# Patient Record
Sex: Female | Born: 1965 | Race: White | Hispanic: No | Marital: Married | Smoking: Never smoker
Health system: Southern US, Community
[De-identification: ages and names within clinical notes are randomized; demographics above are authoritative.]

## PROBLEM LIST (undated history)

## (undated) DIAGNOSIS — G43909 Migraine, unspecified, not intractable, without status migrainosus: Secondary | ICD-10-CM

## (undated) DIAGNOSIS — Z923 Personal history of irradiation: Secondary | ICD-10-CM

## (undated) DIAGNOSIS — IMO0002 Reserved for concepts with insufficient information to code with codable children: Secondary | ICD-10-CM

## (undated) DIAGNOSIS — R87619 Unspecified abnormal cytological findings in specimens from cervix uteri: Secondary | ICD-10-CM

## (undated) DIAGNOSIS — E785 Hyperlipidemia, unspecified: Secondary | ICD-10-CM

## (undated) DIAGNOSIS — D219 Benign neoplasm of connective and other soft tissue, unspecified: Secondary | ICD-10-CM

## (undated) DIAGNOSIS — I890 Lymphedema, not elsewhere classified: Secondary | ICD-10-CM

## (undated) DIAGNOSIS — K219 Gastro-esophageal reflux disease without esophagitis: Secondary | ICD-10-CM

## (undated) DIAGNOSIS — N200 Calculus of kidney: Secondary | ICD-10-CM

## (undated) DIAGNOSIS — R32 Unspecified urinary incontinence: Secondary | ICD-10-CM

## (undated) DIAGNOSIS — Z87442 Personal history of urinary calculi: Secondary | ICD-10-CM

## (undated) DIAGNOSIS — C50919 Malignant neoplasm of unspecified site of unspecified female breast: Secondary | ICD-10-CM

## (undated) DIAGNOSIS — Z9221 Personal history of antineoplastic chemotherapy: Secondary | ICD-10-CM

## (undated) DIAGNOSIS — N979 Female infertility, unspecified: Secondary | ICD-10-CM

## (undated) HISTORY — DX: Unspecified urinary incontinence: R32

## (undated) HISTORY — PX: COLPOSCOPY: SHX161

## (undated) HISTORY — DX: Migraine, unspecified, not intractable, without status migrainosus: G43.909

## (undated) HISTORY — DX: Reserved for concepts with insufficient information to code with codable children: IMO0002

## (undated) HISTORY — PX: EXPLORATORY LAPAROTOMY: SUR591

## (undated) HISTORY — DX: Female infertility, unspecified: N97.9

## (undated) HISTORY — DX: Unspecified abnormal cytological findings in specimens from cervix uteri: R87.619

## (undated) HISTORY — DX: Malignant neoplasm of unspecified site of unspecified female breast: C50.919

## (undated) HISTORY — PX: CERVICAL CONE BIOPSY: SUR198

## (undated) HISTORY — PX: BREAST SURGERY: SHX581

## (undated) HISTORY — DX: Lymphedema, not elsewhere classified: I89.0

## (undated) HISTORY — DX: Benign neoplasm of connective and other soft tissue, unspecified: D21.9

---

## 1996-10-13 DIAGNOSIS — C50919 Malignant neoplasm of unspecified site of unspecified female breast: Secondary | ICD-10-CM

## 1996-10-13 HISTORY — PX: BREAST LUMPECTOMY: SHX2

## 1996-10-13 HISTORY — DX: Malignant neoplasm of unspecified site of unspecified female breast: C50.919

## 1996-10-13 HISTORY — PX: OTHER SURGICAL HISTORY: SHX169

## 2004-08-20 ENCOUNTER — Other Ambulatory Visit: Admission: RE | Admit: 2004-08-20 | Discharge: 2004-08-20 | Payer: Self-pay | Admitting: Obstetrics and Gynecology

## 2005-02-21 ENCOUNTER — Encounter: Admission: RE | Admit: 2005-02-21 | Discharge: 2005-02-21 | Payer: Self-pay | Admitting: Internal Medicine

## 2005-03-06 ENCOUNTER — Encounter: Admission: RE | Admit: 2005-03-06 | Discharge: 2005-03-06 | Payer: Self-pay | Admitting: Internal Medicine

## 2005-07-25 ENCOUNTER — Ambulatory Visit (HOSPITAL_COMMUNITY): Admission: RE | Admit: 2005-07-25 | Discharge: 2005-07-25 | Payer: Self-pay | Admitting: Internal Medicine

## 2005-11-24 ENCOUNTER — Other Ambulatory Visit: Admission: RE | Admit: 2005-11-24 | Discharge: 2005-11-24 | Payer: Self-pay | Admitting: Obstetrics and Gynecology

## 2006-02-23 ENCOUNTER — Encounter: Admission: RE | Admit: 2006-02-23 | Discharge: 2006-02-23 | Payer: Self-pay | Admitting: Obstetrics and Gynecology

## 2006-04-21 ENCOUNTER — Encounter: Admission: RE | Admit: 2006-04-21 | Discharge: 2006-07-20 | Payer: Self-pay | Admitting: Internal Medicine

## 2006-07-21 ENCOUNTER — Encounter: Admission: RE | Admit: 2006-07-21 | Discharge: 2006-10-19 | Payer: Self-pay | Admitting: Internal Medicine

## 2007-02-02 ENCOUNTER — Ambulatory Visit: Payer: Self-pay | Admitting: Pulmonary Disease

## 2007-02-02 LAB — CONVERTED CEMR LAB
ALT: 21 U/L
AST: 20 U/L
Albumin: 3.8 g/dL
Alkaline Phosphatase: 60 U/L
BUN: 10 mg/dL
Basophils Absolute: 0 K/uL
Basophils Relative: 0.7 %
Bilirubin Urine: NEGATIVE
Bilirubin, Direct: 0.1 mg/dL
CO2: 28 meq/L
Calcium: 8.7 mg/dL
Chloride: 107 meq/L
Cholesterol: 278 mg/dL
Creatinine, Ser: 0.8 mg/dL
Direct LDL: 197.8 mg/dL
Eosinophils Absolute: 0.1 K/uL
Eosinophils Relative: 2.5 %
GFR calc Af Amer: 101 mL/min
GFR calc non Af Amer: 84 mL/min
Glucose, Bld: 103 mg/dL — ABNORMAL HIGH
HCT: 38.6 %
HDL: 51.9 mg/dL
Hemoglobin: 13.6 g/dL
Ketones, ur: NEGATIVE mg/dL
Leukocytes, UA: NEGATIVE
Lymphocytes Relative: 25.3 %
MCHC: 35.3 g/dL
MCV: 93.5 fL
Monocytes Absolute: 0.3 K/uL
Monocytes Relative: 6.8 %
Neutro Abs: 2.8 K/uL
Neutrophils Relative %: 64.7 %
Nitrite: NEGATIVE
Platelets: 268 K/uL
Potassium: 4 meq/L
RBC: 4.13 M/uL
RDW: 11.9 %
Sodium: 140 meq/L
Specific Gravity, Urine: 1.015
TSH: 1.57 u[IU]/mL
Total Bilirubin: 0.8 mg/dL
Total CHOL/HDL Ratio: 5.4
Total Protein, Urine: NEGATIVE mg/dL
Total Protein: 6.6 g/dL
Triglycerides: 170 mg/dL — ABNORMAL HIGH
Urine Glucose: NEGATIVE mg/dL
Urobilinogen, UA: 0.2
VLDL: 34 mg/dL
WBC: 4.3 10*3/microliter — ABNORMAL LOW
pH: 6.5

## 2007-02-11 ENCOUNTER — Ambulatory Visit: Payer: Self-pay | Admitting: Pulmonary Disease

## 2007-02-11 LAB — CONVERTED CEMR LAB
Fecal Occult Blood: NEGATIVE
OCCULT 1: NEGATIVE
OCCULT 2: NEGATIVE
OCCULT 3: NEGATIVE
OCCULT 4: NEGATIVE

## 2007-03-01 ENCOUNTER — Encounter: Admission: RE | Admit: 2007-03-01 | Discharge: 2007-03-01 | Payer: Self-pay | Admitting: Obstetrics and Gynecology

## 2007-05-10 ENCOUNTER — Inpatient Hospital Stay (HOSPITAL_COMMUNITY): Admission: AD | Admit: 2007-05-10 | Discharge: 2007-05-12 | Payer: Self-pay | Admitting: Internal Medicine

## 2007-05-14 ENCOUNTER — Encounter: Admission: RE | Admit: 2007-05-14 | Discharge: 2007-05-14 | Payer: Self-pay | Admitting: *Deleted

## 2007-06-11 ENCOUNTER — Encounter: Admission: RE | Admit: 2007-06-11 | Discharge: 2007-07-05 | Payer: Self-pay | Admitting: *Deleted

## 2008-03-09 ENCOUNTER — Encounter: Admission: RE | Admit: 2008-03-09 | Discharge: 2008-03-09 | Payer: Self-pay | Admitting: Obstetrics and Gynecology

## 2009-03-13 ENCOUNTER — Encounter: Admission: RE | Admit: 2009-03-13 | Discharge: 2009-03-13 | Payer: Self-pay | Admitting: Obstetrics and Gynecology

## 2010-02-04 ENCOUNTER — Encounter: Admission: RE | Admit: 2010-02-04 | Discharge: 2010-02-27 | Payer: Self-pay | Admitting: *Deleted

## 2010-03-22 ENCOUNTER — Encounter: Admission: RE | Admit: 2010-03-22 | Discharge: 2010-03-22 | Payer: Self-pay | Admitting: Obstetrics and Gynecology

## 2011-02-25 NOTE — Discharge Summary (Signed)
[name], Conway              ACCOUNT NO.:  [id number]   MEDICAL RECORD NO.:  [id number]          PATIENT TYPE:  INP   LOCATION:  5737                         FACILITY:  [facility]   PHYSICIAN:  Barnetta Chapel, MDDATE OF BIRTH:  26-Jul-1966   DATE OF ADMISSION:  05/10/2007  DATE OF DISCHARGE:  05/12/2007                               DISCHARGE SUMMARY   PRIMARY CARE [name]:   DISCHARGE DIAGNOSES:  1. Cellulitis of the right upper extremity.  2. Chronic lymphedema of the right upper extremity.  3. History of breast cancer status post lumpectomy and axillary      dissection.  4. Hypokalemia repleted.   DISCHARGE MEDICATIONS:  1. Doxycycline 100 mg p.o. t.i.d. for 10 days.  2. Ambien 500 p.o. q.h.s. p.r.n. (seven tablets given).  3. Vicodin 5/500 one tab p.o. q.6 h. p.r.n. (28 tablets given).   CONSULTATIONS:  None.   BRIEF HISTORY/HOSPITAL COURSE:  Please refer to the History and Physical  done on May 10, 2007.  The patient is a 45 year old female with past  medical history significant for breast cancer status post lumpectomy and  axillary dissection with secondary chronic lymphedema.  The patient was  admitted with cellulitis of the right upper extremity.   The patient was admitted to the regular medical floor.  She was started  on IV vancomycin.  Cellulitis has improved significantly.  She will be  discharged home on p.o. doxycycline.  During her stay in the hospital  the patient's potassium was observed to be low.  Potassium was repleted.   She will be discharged back home today to the care of the primary care  Audrey Conway.   DISCHARGE PLANS:  1. Discharge patient home on above medications.  2. Follow with the primary care [name] in a week.  3. Regular diet  4. Activity as tolerated.      Barnetta Chapel, MD  Electronically Signed    SIO/MEDQ  D:  05/12/2007  T:  05/12/2007  Job:  [id number]

## 2011-02-25 NOTE — H&P (Signed)
NAMECAITLAIN, TWEED              ACCOUNT NO.:  [id number]   MEDICAL RECORD NO.:  [id number]          PATIENT TYPE:  INP   LOCATION:  5737                         FACILITY:  [facility]   PHYSICIAN:  Corinna L. Lendell Caprice, MDDATE OF BIRTH:  [birth year]   DATE OF ADMISSION:  05/10/2007  DATE OF DISCHARGE:                              HISTORY & PHYSICAL   CHIEF COMPLAINT:  Fever and arm pain.   HISTORY OF PRESENT ILLNESS:  Ms. Wheeland is a pleasant 45 year old white  female patient of Dr. Particia Jasper, who is directly admitted for cellulitis  of the right arm.  She has a history of breast cancer and is status post  lumpectomy and axillary dissection on the right with chronic lymphedema  of the right arm.  She hit her neck on a counter 2 days ago and since  then she began having swelling of her shoulder area as well as some  redness; she also had redness that extended down her arm.  She has had  fevers to 103, almost 104 at home.  She has had rigors.  She has had  night sweats.  She took Tylenol today and her fever is down.  She has  had nausea and her appetite has been poor.  She also vomited a few  times.  She has no history of cellulitis in the past.   PAST MEDICAL HISTORY:  As above.   MEDICATIONS:  Tylenol or Motrin as needed.   ALLERGIES:  No known drug allergies.   SOCIAL HISTORY:  She drinks occasionally.  She does not smoke.  No  drugs.   FAMILY HISTORY:  Father has diabetes.   PAST SURGICAL HISTORY:  1. C-section.  2. Lumpectomy and axillary node dissection.   REVIEW OF SYSTEMS:  As above, otherwise negative.   PHYSICAL EXAMINATION:  VITAL SIGNS:  Temperature is 98.4, pulse 115,  respiratory rate 20, blood pressure 131/86.  GENERAL:  The patient is a somewhat anxious-appearing white female.  HEENT:  Normocephalic, atraumatic.  Pupils are equally round and  reactive to light.  Moist mucous membranes.  NECK:  Supple.  No lymphadenopathy.  UPPER EXTREMITIES:  She does have  some fullness over the right clavicle  with slight erythema, but no warmth.  She has erythema, warmth and  induration involving the right arm from the wrist nearly up to the  shoulder circumferentially.  LUNGS:  Clear to auscultation bilaterally without wheezes, rhonchi or  rales.  CARDIOVASCULAR:  Fast, regular.  No murmurs, gallops or rubs.  ABDOMEN:  Normal bowel sounds.  Soft, nontender and nondistended.  GU AND RECTAL:  Deferred.  EXTREMITIES:  As above.  SKIN:  As above.  NEUROLOGIC:  Alert and oriented.  Cranial nerves and sensory and motor  exam are grossly intact.  PSYCHIATRIC:  The patient is somewhat anxious-appearing and tearful.   LABORATORY DATA:  Labs are pending.   ASSESSMENT AND PLAN:  1. Right arm cellulitis.  I will check a CBC, baseline labs, blood      cultures and give IV vancomycin.  She has been admitted to the  floor.  2. History of breast cancer with lumpectomy and axillary node      dissection with resulting chronic lymphedema of the right arm.      Corinna L. Lendell Caprice, MD  Electronically Signed     CLS/MEDQ  D:  05/10/2007  T:  05/11/2007  Job:  [id number]   cc:   Dr. Particia Jasper

## 2011-03-13 ENCOUNTER — Other Ambulatory Visit: Payer: Self-pay | Admitting: Obstetrics and Gynecology

## 2011-03-13 DIAGNOSIS — Z1231 Encounter for screening mammogram for malignant neoplasm of breast: Secondary | ICD-10-CM

## 2011-04-03 ENCOUNTER — Ambulatory Visit
Admission: RE | Admit: 2011-04-03 | Discharge: 2011-04-03 | Disposition: A | Discharge status: Home / Self Care | Payer: BC Managed Care – PPO | Source: Ambulatory Visit | Attending: Obstetrics and Gynecology | Admitting: Obstetrics and Gynecology

## 2011-04-03 DIAGNOSIS — Z1231 Encounter for screening mammogram for malignant neoplasm of breast: Secondary | ICD-10-CM

## 2011-05-13 ENCOUNTER — Telehealth: Payer: Self-pay | Admitting: Pulmonary Disease

## 2011-05-13 NOTE — Telephone Encounter (Signed)
Per SN---so sorry---but SN not seeing any new primary care pts due to his schedule being so booked up.  No openings, unable to get pts in to see him.  SN does not do much primary care now.  But we would be happy to help her set up appt with primary care if she would like.  Will call pt tomorrow to make her aware.

## 2011-05-13 NOTE — Telephone Encounter (Signed)
Leigh, Please ask SN if he wants/willing to see this patient. Thanks.

## 2011-05-13 NOTE — Telephone Encounter (Signed)
Requested pts old chart.  SN will review.

## 2011-05-14 NOTE — Telephone Encounter (Signed)
Called spoke with patient, advised that SN is not accepting new pt's at this time.  Pt verbalized her understanding.  Pt denied assistance with setting up an appt with primary care.  Pt does request a new consult with a pulm physician as she reports she was recently hosp in [place] for PNA.  appt scheduled with RB 8.14.12 @ 1600 > pt aware to arrive ealry with ins card and will bring records to appt.  Will sign off on message.

## 2011-05-27 ENCOUNTER — Encounter: Payer: Self-pay | Admitting: Emergency Medicine

## 2011-05-27 ENCOUNTER — Ambulatory Visit (INDEPENDENT_AMBULATORY_CARE_PROVIDER_SITE_OTHER): Payer: BC Managed Care – PPO | Admitting: Emergency Medicine

## 2011-05-27 ENCOUNTER — Institutional Professional Consult (permissible substitution): Payer: BC Managed Care – PPO | Admitting: Emergency Medicine

## 2011-05-27 DIAGNOSIS — R053 Chronic cough: Secondary | ICD-10-CM

## 2011-05-27 DIAGNOSIS — R918 Other nonspecific abnormal finding of lung field: Secondary | ICD-10-CM

## 2011-05-27 DIAGNOSIS — R9389 Abnormal findings on diagnostic imaging of other specified body structures: Secondary | ICD-10-CM

## 2011-05-27 DIAGNOSIS — R05 Cough: Secondary | ICD-10-CM

## 2011-05-27 NOTE — Progress Notes (Signed)
  Subjective:    Patient ID: Audrey Conway, female    DOB: January 05, 1966, 45 y.o.   MRN: [id number]  HPI 45 yo woman, never smoker, hx of breast CA s/p lumpectomy (1998) c/b lymphedema R arm, allergic rhinitis.  Was traveling in [place] in late July, developed URI type sx, then fever + N/V. Evolved a dry hacking cough. White mucous, no blood.  She sought care when fever got high, felt like she has when "her arm gets infected." She had a CXR with RUL nodular infiltrate. Was treated for possible lymphangitis R arm and also RUL PNA.    Review of Systems  Constitutional: Negative for fever, chills, fatigue and unexpected weight change.  HENT: Positive for congestion. Negative for rhinorrhea, sneezing and postnasal drip.   Respiratory: Positive for cough. Negative for choking, shortness of breath, wheezing and stridor.     Past Medical History  Diagnosis Date  . Breast cancer   . Allergic rhinitis   . Lymphedema      Family History  Problem Relation Age of Onset  . Allergies Father   . Lymphoma Brother      History   Social History  . Marital Status: Married    Spouse Name: N/A    Number of Children: N/A  . Years of Education: N/A   Occupational History  . homemaker    Social History Main Topics  . Smoking status: Never Smoker   . Smokeless tobacco: Never Used  . Alcohol Use: Yes     socially  . Drug Use: No  . Sexually Active: Not on file   Other Topics Concern  . Not on file   Social History Narrative  . No narrative on file     No Known Allergies   No outpatient prescriptions prior to visit.         Objective:   Physical Exam  Gen: Pleasant, well-nourished, in no distress,  normal affect  ENT: No lesions,  mouth clear,  oropharynx clear, no postnasal drip  Neck: No JVD, no TMG, no carotid bruits  Lungs: No use of accessory muscles, no dullness to percussion, clear without rales or rhonchi  Cardiovascular: RRR, heart sounds normal, no murmur or gallops,  no peripheral edema  Musculoskeletal: No deformities, no cyanosis or clubbing  Neuro: alert, non focal  Skin: Warm, no lesions or rashes      Assessment & Plan:  Chronic cough loratadine 10mg  nasanex  If still coughiing in 2 weeks then start omeprazole   Abnormal CXR ROV 3 weeks w a CXR Get old films from [place].

## 2011-05-27 NOTE — Progress Notes (Signed)
  Subjective:    Patient ID: Audrey Conway, female    DOB: [birth year], 45 y.o.   MRN: [id number]  HPI    Review of Systems  Constitutional: Negative.   HENT: Positive for sore throat.   Eyes: Negative.   Respiratory: Positive for cough.   Cardiovascular: Negative.   Gastrointestinal: Negative.   Genitourinary: Negative.   Musculoskeletal: Negative.   Skin: Negative.   Neurological: Positive for headaches.  Hematological: Negative.   Psychiatric/Behavioral: Negative.        Objective:   Physical Exam        Assessment & Plan:

## 2011-05-27 NOTE — Patient Instructions (Signed)
We will follow up in 3 weeks with a CXR We will get copies of your old films Start loratadine 10mg  daily Start Nasonex 2 sprays each side daily If your cough continues after 2 weeks, then start omeprazole 20mg  daily.

## 2011-05-27 NOTE — Assessment & Plan Note (Signed)
ROV 3 weeks w a CXR Get old films from [place].

## 2011-05-27 NOTE — Assessment & Plan Note (Signed)
loratadine 10mg  nasanex  If still coughiing in 2 weeks then start omeprazole

## 2011-06-23 ENCOUNTER — Ambulatory Visit (INDEPENDENT_AMBULATORY_CARE_PROVIDER_SITE_OTHER)
Admission: RE | Admit: 2011-06-23 | Discharge: 2011-06-23 | Disposition: A | Discharge status: Home / Self Care | Payer: BC Managed Care – PPO | Source: Ambulatory Visit | Attending: Emergency Medicine | Admitting: Emergency Medicine

## 2011-06-23 ENCOUNTER — Encounter: Payer: Self-pay | Admitting: Emergency Medicine

## 2011-06-23 ENCOUNTER — Ambulatory Visit (INDEPENDENT_AMBULATORY_CARE_PROVIDER_SITE_OTHER): Payer: BC Managed Care – PPO | Admitting: Emergency Medicine

## 2011-06-23 VITALS — BP 138/86 | HR 82 | Temp 98.2°F | Ht 62.0 in | Wt 177.6 lb

## 2011-06-23 DIAGNOSIS — R9389 Abnormal findings on diagnostic imaging of other specified body structures: Secondary | ICD-10-CM

## 2011-06-23 DIAGNOSIS — R05 Cough: Secondary | ICD-10-CM

## 2011-06-23 DIAGNOSIS — R918 Other nonspecific abnormal finding of lung field: Secondary | ICD-10-CM

## 2011-06-23 NOTE — Assessment & Plan Note (Signed)
Will continue loratadine + nasonex Consider [place] in future Hold off on omeprazole.

## 2011-06-23 NOTE — Patient Instructions (Signed)
Your CXR today shows resolution of changes observed on 05/07/11.  Continue your loratadine daily and nasonex daily  Follow up with Dr Delton Coombes if you develop any problems.

## 2011-06-23 NOTE — Assessment & Plan Note (Signed)
Improved compared with 05/07/11 outside film Will send the film down for comparison by rads

## 2011-06-23 NOTE — Progress Notes (Signed)
  Subjective:    Patient ID: Audrey Conway, female    DOB: Aug 28, 1966, 45 y.o.   MRN: [id number] HPI 45 yo woman, never smoker, hx of breast CA s/p lumpectomy (1998) c/b lymphedema R arm, allergic rhinitis.  Was traveling in [place] in late July, developed URI type sx, then fever + N/V. Evolved a dry hacking cough. White mucous, no blood.  She sought care when fever got high, felt like she has when "her arm gets infected." She had a CXR with RUL nodular infiltrate. Was treated for possible lymphangitis R arm and also RUL PNA.   ROV 06/23/11 -- follows up for cough, possible RUL PNA in 04/2011 (CXR from 05/07/11 confirms nodular infiltrate). Her cough is better, still has some drainage down the back of her throat. She is on loratadine and Nasonex.  Didn't start omeprazole   Past Medical History  Diagnosis Date  . Breast cancer   . Allergic rhinitis   . Lymphedema      Family History  Problem Relation Age of Onset  . Allergies Father   . Lymphoma Brother      History   Social History  . Marital Status: Married    Spouse Name: N/A    Number of Children: N/A  . Years of Education: N/A   Occupational History  . homemaker    Social History Main Topics  . Smoking status: Never Smoker   . Smokeless tobacco: Never Used  . Alcohol Use: Yes     socially  . Drug Use: No  . Sexually Active: Not on file   Other Topics Concern  . Not on file   Social History Narrative  . No narrative on file     No Known Allergies   No outpatient prescriptions prior to visit.         Objective:   Physical Exam  Gen: Pleasant, well-nourished, in no distress,  normal affect  ENT: No lesions,  mouth clear,  oropharynx clear, no postnasal drip  Neck: No JVD, no TMG, no carotid bruits  Lungs: No use of accessory muscles, no dullness to percussion, clear without rales or rhonchi  Cardiovascular: RRR, heart sounds normal, no murmur or gallops, no peripheral edema  Musculoskeletal: No  deformities, no cyanosis or clubbing  Neuro: alert, non focal  Skin: Warm, no lesions or rashes      Assessment & Plan:  Chronic cough Will continue loratadine + nasonex Consider [place] in future Hold off on omeprazole.   Abnormal CXR Improved compared with 05/07/11 outside film Will send the film down for comparison by rads

## 2011-06-27 ENCOUNTER — Telehealth: Payer: Self-pay | Admitting: Emergency Medicine

## 2011-06-27 DIAGNOSIS — R05 Cough: Secondary | ICD-10-CM

## 2011-06-27 MED ORDER — MOMETASONE FUROATE 50 MCG/ACT NA SUSP: 2.0000 | Freq: Every day | NASAL | Status: DC

## 2011-06-27 NOTE — Telephone Encounter (Signed)
Left detailed msg regarding Nasonex refill. RX sent and told pt to call the pharmacy to make sure they have this ready before she goes to pick this up.

## 2011-07-28 LAB — CBC
RBC: 3.87
RDW: 12.5

## 2011-07-28 LAB — DIFFERENTIAL
Basophils Relative: 1
Lymphocytes Relative: 14
Monocytes Absolute: 0.4
Monocytes Relative: 6
Neutro Abs: 5.9

## 2011-07-28 LAB — COMPREHENSIVE METABOLIC PANEL
Albumin: 3.4 — ABNORMAL LOW
Alkaline Phosphatase: 61
CO2: 27
Glucose, Bld: 111 — ABNORMAL HIGH
Potassium: 3.2 — ABNORMAL LOW
Total Bilirubin: 0.7

## 2011-07-28 LAB — CULTURE, BLOOD (ROUTINE X 2): Culture: NO GROWTH

## 2012-03-10 ENCOUNTER — Other Ambulatory Visit: Payer: Self-pay | Admitting: Obstetrics and Gynecology

## 2012-03-10 DIAGNOSIS — Z1231 Encounter for screening mammogram for malignant neoplasm of breast: Secondary | ICD-10-CM

## 2012-04-07 ENCOUNTER — Ambulatory Visit
Admission: RE | Admit: 2012-04-07 | Discharge: 2012-04-07 | Disposition: A | Discharge status: Home / Self Care | Payer: BC Managed Care – PPO | Source: Ambulatory Visit | Attending: Obstetrics and Gynecology | Admitting: Obstetrics and Gynecology

## 2012-04-07 DIAGNOSIS — Z1231 Encounter for screening mammogram for malignant neoplasm of breast: Secondary | ICD-10-CM

## 2012-05-06 ENCOUNTER — Encounter: Payer: Self-pay | Admitting: Obstetrics and Gynecology

## 2013-03-18 ENCOUNTER — Other Ambulatory Visit: Payer: Self-pay

## 2013-03-18 DIAGNOSIS — Z1231 Encounter for screening mammogram for malignant neoplasm of breast: Secondary | ICD-10-CM

## 2013-03-18 DIAGNOSIS — Z853 Personal history of malignant neoplasm of breast: Secondary | ICD-10-CM

## 2013-05-03 ENCOUNTER — Ambulatory Visit
Admission: RE | Admit: 2013-05-03 | Discharge: 2013-05-03 | Disposition: A | Discharge status: Home / Self Care | Payer: BC Managed Care – PPO | Source: Ambulatory Visit

## 2013-05-03 DIAGNOSIS — Z1231 Encounter for screening mammogram for malignant neoplasm of breast: Secondary | ICD-10-CM

## 2013-05-03 DIAGNOSIS — Z853 Personal history of malignant neoplasm of breast: Secondary | ICD-10-CM

## 2014-02-01 ENCOUNTER — Other Ambulatory Visit: Payer: Self-pay | Admitting: Family Medicine

## 2014-02-01 DIAGNOSIS — Z853 Personal history of malignant neoplasm of breast: Secondary | ICD-10-CM

## 2014-02-01 DIAGNOSIS — R1030 Lower abdominal pain, unspecified: Secondary | ICD-10-CM

## 2014-02-02 ENCOUNTER — Ambulatory Visit
Admission: RE | Admit: 2014-02-02 | Discharge: 2014-02-02 | Disposition: A | Discharge status: Home / Self Care | Payer: BC Managed Care – PPO | Source: Ambulatory Visit | Attending: Family Medicine | Admitting: Family Medicine

## 2014-02-02 DIAGNOSIS — R1030 Lower abdominal pain, unspecified: Secondary | ICD-10-CM

## 2014-02-02 DIAGNOSIS — Z853 Personal history of malignant neoplasm of breast: Secondary | ICD-10-CM

## 2014-02-13 ENCOUNTER — Telehealth: Payer: Self-pay | Admitting: Certified Nurse Midwife

## 2014-02-13 NOTE — Telephone Encounter (Signed)
Pt has a new pt appt with Debbie tomorrow but having menopausal bleeding today. Please call to advise.

## 2014-02-13 NOTE — Telephone Encounter (Signed)
Message left to return call to Callan Norden at [phone].    

## 2014-02-13 NOTE — Telephone Encounter (Signed)
Spoke with patient. She states she has not had a period since December of 2014 and was having irregular periods prior. She started her period on 5/1 and has concerns with being on her period an new gyn appointment that she has tomorrow, but would like to keep appointment as she has concerns she would like to discuss. Patient states she has a new dx with a possible fibroid as she has recent pelvic ultrasound with pcp which she will bring tomorrow. I advised to keep her new gyn appointment as scheduled and she and Debbi can talk about exam at that time.

## 2014-02-14 ENCOUNTER — Encounter: Payer: Self-pay | Admitting: Certified Nurse Midwife

## 2014-02-14 ENCOUNTER — Ambulatory Visit (INDEPENDENT_AMBULATORY_CARE_PROVIDER_SITE_OTHER): Payer: BC Managed Care – PPO | Admitting: Certified Nurse Midwife

## 2014-02-14 VITALS — BP 120/82 | HR 72 | Resp 16 | Ht 61.25 in | Wt 186.0 lb

## 2014-02-14 DIAGNOSIS — Z124 Encounter for screening for malignant neoplasm of cervix: Secondary | ICD-10-CM

## 2014-02-14 DIAGNOSIS — C50919 Malignant neoplasm of unspecified site of unspecified female breast: Secondary | ICD-10-CM | POA: Insufficient documentation

## 2014-02-14 DIAGNOSIS — Z01419 Encounter for gynecological examination (general) (routine) without abnormal findings: Secondary | ICD-10-CM

## 2014-02-14 NOTE — Patient Instructions (Addendum)
EXERCISE AND DIET:  We recommended that you start or continue a regular exercise program for good health. Regular exercise means any activity that makes your heart beat faster and makes you sweat.  We recommend exercising at least 30 minutes per day at least 3 days a week, preferably 4 or 5.  We also recommend a diet low in fat and sugar.  Inactivity, poor dietary choices and obesity can cause diabetes, heart attack, stroke, and kidney damage, among others.    ALCOHOL AND SMOKING:  Women should limit their alcohol intake to no more than 7 drinks/beers/glasses of wine (combined, not each!) per week. Moderation of alcohol intake to this level decreases your risk of breast cancer and liver damage. And of course, no recreational drugs are part of a healthy lifestyle.  And absolutely no smoking or even second hand smoke. Most people know smoking can cause heart and lung diseases, but did you know it also contributes to weakening of your bones? Aging of your skin?  Yellowing of your teeth and nails?  CALCIUM AND VITAMIN D:  Adequate intake of calcium and Vitamin D are recommended.  The recommendations for exact amounts of these supplements seem to change often, but generally speaking 600 mg of calcium (either carbonate or citrate) and 800 units of Vitamin D per day seems prudent. Certain women may benefit from higher intake of Vitamin D.  If you are among these women, your doctor will have told you during your visit.    PAP SMEARS:  Pap smears, to check for cervical cancer or precancers,  have traditionally been done yearly, although recent scientific advances have shown that most women can have pap smears less often.  However, every woman still should have a physical exam from her gynecologist every year. It will include a breast check, inspection of the vulva and vagina to check for abnormal growths or skin changes, a visual exam of the cervix, and then an exam to evaluate the size and shape of the uterus and  ovaries.  And after 50 years of age, a rectal exam is indicated to check for rectal cancers. We will also provide age appropriate advice regarding health maintenance, like when you should have certain vaccines, screening for sexually transmitted diseases, bone density testing, colonoscopy, mammograms, etc.   MAMMOGRAMS:  All women over 40 years old should have a yearly mammogram. Many facilities now offer a "3D" mammogram, which may cost around $50 extra out of pocket. If possible,  we recommend you accept the option to have the 3D mammogram performed.  It both reduces the number of women who will be called back for extra views which then turn out to be normal, and it is better than the routine mammogram at detecting truly abnormal areas.    COLONOSCOPY:  Colonoscopy to screen for colon cancer is recommended for all women at age 50.  We know, you hate the idea of the prep.  We agree, BUT, having colon cancer and not knowing it is worse!!  Colon cancer so often starts as a polyp that can be seen and removed at colonscopy, which can quite literally save your life!  And if your first colonoscopy is normal and you have no family history of colon cancer, most women don't have to have it again for 10 years.  Once every ten years, you can do something that may end up saving your life, right?  We will be happy to help you get it scheduled when you are ready.    Be sure to check your insurance coverage so you understand how much it will cost.  It may be covered as a preventative service at no cost, but you should check your particular policy.     It was great to meet you today. Feel free to call if any questions or concerns. DebbiPerimenopause Perimenopause is the time when your body begins to move into the menopause (no menstrual period for 12 straight months). It is a natural process. Perimenopause can begin 2 8 years before the menopause and usually lasts for 1 year after the menopause. During this time, your  ovaries may or may not produce an egg. The ovaries vary in their production of estrogen and progesterone hormones each month. This can cause irregular menstrual periods, difficulty getting pregnant, vaginal bleeding between periods, and uncomfortable symptoms. CAUSES  Irregular production of the ovarian hormones, estrogen and progesterone, and not ovulating every month.  Other causes include:  Tumor of the pituitary gland in the brain.  Medical disease that affects the ovaries.  Radiation treatment.  Chemotherapy.  Unknown causes.  Heavy smoking and excessive alcohol intake can bring on perimenopause sooner. SIGNS AND SYMPTOMS   Hot flashes.  Night sweats.  Irregular menstrual periods.  Decreased sex drive.  Vaginal dryness.  Headaches.  Mood swings.  Depression.  Memory problems.  Irritability.  Tiredness.  Weight gain.  Trouble getting pregnant.  The beginning of losing bone cells (osteoporosis).  The beginning of hardening of the arteries (atherosclerosis). DIAGNOSIS  Your health care provider will make a diagnosis by analyzing your age, menstrual history, and symptoms. He or she will do a physical exam and note any changes in your body, especially your female organs. Female hormone tests may or may not be helpful depending on the amount of female hormones you produce and when you produce them. However, other hormone tests may be helpful to rule out other problems. TREATMENT  In some cases, no treatment is needed. The decision on whether treatment is necessary during the perimenopause should be made by you and your health care provider based on how the symptoms are affecting you and your lifestyle. Various treatments are available, such as:  Treating individual symptoms with a specific medicine for that symptom.  Herbal medicines that can help specific symptoms.  Counseling.  Group therapy. HOME CARE INSTRUCTIONS   Keep track of your menstrual periods  (when they occur, how heavy they are, how long between periods, and how long they last) as well as your symptoms and when they started.  Only take over-the-counter or prescription medicines as directed by your health care provider.  Sleep and rest.  Exercise.  Eat a diet that contains calcium (good for your bones) and soy (acts like the estrogen hormone).  Do not smoke.  Avoid alcoholic beverages.  Take vitamin supplements as recommended by your health care provider. Taking vitamin E may help in certain cases.  Take calcium and vitamin D supplements to help prevent bone loss.  Group therapy is sometimes helpful.  Acupuncture may help in some cases. SEEK MEDICAL CARE IF:   You have questions about any symptoms you are having.  You need a referral to a specialist (gynecologist, psychiatrist, or psychologist). SEEK IMMEDIATE MEDICAL CARE IF:   You have vaginal bleeding.  Your period lasts longer than 8 days.  Your periods are recurring sooner than 21 days.  You have bleeding after intercourse.  You have severe depression.  You have pain when you urinate.  You have  severe headaches.  You have vision problems. Document Released: 11/06/2004 Document Revised: 07/20/2013 Document Reviewed: 04/28/2013 [facility] Patient Information 2014 [place], [place].

## 2014-02-14 NOTE — Progress Notes (Signed)
Reviewed personally.  Agree with plan of care.  Felipa Emory, MD.

## 2014-02-14 NOTE — Progress Notes (Signed)
48 y.o. G26P1001 Married Caucasian Fe here to establish gyn care and  for annual exam. Perimenopausal with cycle changes. Having periods of amenorrhea then long cycle. LNMP 1/15 and then started 5 days ago, moderate, no issues with today. Patient under evaluation with PCP for GI issues and blood in urine. Patient was seen this am for follow up urine, no results yet. Will be seeing GI for abdominal discomfort and gas. Recent PUS she brought with her, indicates a 3.2 cm subserosal fundal fibroid. PUS date 02/02/14. Uterine lining prior to period 36mm, Uterine size 12.8x5.7.7.4 cm. Ovaries noted, no comment. Loops of gas on PUS were noted and are the purpose of GI evaluation. Patient history of lymphedema from Right breast cancer surgery at age 40, which she has periodic cellulitis which is managed by PCP. 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 Patient has a daughter and may consider screening. Complaining of some vaginal dryness with sexual activity, but afraid to use anything for.No other health issues today.  Patient's last menstrual period was 02/10/2014.          Sexually active: yes  The current method of family planning is vasectomy.    Exercising: yes  walking Smoker:  no  Health Maintenance: Pap:  2012? Normal per patient abnormal pap with conization over 20 yrs ago, normal paps after MMG: 05-03-13 normal Colonoscopy:  none BMD:   none TDaP:  Unsure per patient probably up to date will check with PCP Labs: none Self breast exam: done often   reports that she has never smoked. She has never used smokeless tobacco. She reports that she drinks about 3.6 - 4.8 ounces of alcohol per week. She reports that she does not use illicit drugs.  Past Medical History  Diagnosis Date  . Allergic rhinitis   . Lymphedema     rt arm  . Breast cancer 1998    chemo & radiation rt side  . Abnormal Pap smear of cervix     as of 2015, it was over 20 yrs ago   . Migraines     in college  . Dyspareunia   . Fibroid   . Infertility, female   . Urinary incontinence     urine loss with coughing    Past Surgical History  Procedure Laterality Date  . Right lumpectomy  1998  . Cesarean section    . Cervical cone biopsy      as of 2015, it was over 20 yrs ago  . Colposcopy      as of 2015, it was over 75yrs ago  . Exploratory laparotomy      Current Outpatient Prescriptions  Medication Sig Dispense Refill  . Acetaminophen (TYLENOL PO) Take by mouth as needed.      . IBUPROFEN PO Take by mouth as needed.      . loratadine (CLARITIN) 10 MG tablet Take 10 mg by mouth daily.        . Probiotic Product (PROBIOTIC PO) Take by mouth daily.       No current facility-administered medications for this visit.    Family History  Problem Relation Age of Onset  . Allergies Father   . Diabetes Father   . Hypertension Father   . Lymphoma Brother   . Cancer Maternal Grandfather   . Heart attack Paternal Grandfather 77    ROS:  Pertinent items are noted in HPI.  Otherwise, a comprehensive ROS was negative.  Exam:   BP 120/82  Pulse 72  Resp 16  Ht 5' 1.25" (1.556 m)  Wt 186 lb (84.369 kg)  BMI 34.85 kg/m2  LMP 02/10/2014 Height: 5' 1.25" (155.6 cm)  Ht Readings from Last 3 Encounters:  02/14/14 5' 1.25" (1.556 m)  06/23/11 5\' 2"  (1.575 m)  05/27/11 5' 1.5" (1.562 m)    General appearance: alert, cooperative and appears stated age Head: Normocephalic, without obvious abnormality, atraumatic Neck: no adenopathy, supple, symmetrical, trachea midline and thyroid normal to inspection and palpation Lungs: clear to auscultation bilaterally Breasts: normal appearance, no masses or tenderness, No nipple retraction or dimpling, No nipple discharge or bleeding, No axillary or supraclavicular adenopathy, lumpectomy scar noted on right breast Heart: regular rate and rhythm Abdomen: soft, non-tender; no masses,  no organomegaly Extremities:  extremities normal, atraumatic, no cyanosis or edema Skin: Skin color, texture, turgor normal. No rashes or lesions Lymph nodes: Cervical, supraclavicular, and axillary nodes normal. No abnormal inguinal nodes palpated Neurologic: Grossly normal   Pelvic: External genitalia:  no lesions              Urethra:  normal appearing urethra with no masses, tenderness or lesions              Bartholin's and Skene's: normal                 Vagina: normal appearing vagina with normal color and discharge, no lesions, scant red blood noted, no atrophy noted, non tender with exam              Cervix: normal appearance, non tender scant blood noted from cervix              Pap taken: yes Bimanual Exam:  Uterus:  normal size, contour, position, consistency, mobility, non-tender, enlarged, 10 week size weeks size and slightly firm              Adnexa: normal adnexa and no mass, fullness, tenderness               Rectovaginal: Confirms               Anus:  normal sphincter tone, no lesions  A:  Well Woman with normal exam  Contraception spouse vasectomy  Perimenopausal with irregular periods and amenorrhea at times  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 Discussed that weight, thyroid can affect changes. Patient says she has had "lots of lab work and all normal regarding this". Was told just menopausal changes. Declines any labs today. Instructed patient to keep menses  calendar given and advise if no menses in the next month again. Patient agreeable.  Stressed regular mammogram, will schedule  Discussed genetic screening consult availability for self and daughter(18). Patient will consider and advise if desires.  Pap smear taken today with HPVHR  Discussed uterine fibroid can also increase bleeding occurrence, but are benign findings.  Discussed Olive oil use for sexual activity and OTC Replens once nightly for one  week to help with vaginal moisture. Also discussed position change and communication to avoid discomfort. Patient will advise if no change.  Continue follow up as indicated with MD.  Mammogram yearly counseled on breast self exam, mammography screening, adequate intake of calcium and vitamin D, diet and exercise return annually or prn  An After Visit Summary was printed and given to the patient.

## 2014-02-17 ENCOUNTER — Encounter: Payer: Self-pay | Admitting: Certified Nurse Midwife

## 2014-02-17 LAB — IPS PAP TEST WITH HPV

## 2014-02-20 ENCOUNTER — Ambulatory Visit: Payer: BC Managed Care – PPO | Admitting: Family Medicine

## 2014-03-01 ENCOUNTER — Institutional Professional Consult (permissible substitution): Payer: BC Managed Care – PPO | Admitting: Obstetrics and Gynecology

## 2014-03-17 ENCOUNTER — Ambulatory Visit (INDEPENDENT_AMBULATORY_CARE_PROVIDER_SITE_OTHER): Payer: BC Managed Care – PPO | Admitting: Obstetrics and Gynecology

## 2014-03-17 VITALS — BP 130/84 | HR 84 | Temp 98.6°F | Wt 185.6 lb

## 2014-03-17 DIAGNOSIS — R1031 Right lower quadrant pain: Secondary | ICD-10-CM

## 2014-03-17 DIAGNOSIS — Z853 Personal history of malignant neoplasm of breast: Secondary | ICD-10-CM

## 2014-03-17 NOTE — Progress Notes (Signed)
Patient ID: Audrey Conway, female   DOB: [birth year], 48 y.o.   MRN: [id number]  GYNECOLOGY  VISIT   HPI: 48 y.o.   Married  Caucasian  female   [place] with LMP 03/09/14, prior LMP was one month prior, prior to that was January 2015.  here for right lower quadrant abdominal pain.   Was having lower right sided abdominal pain and saw PCP who found fibroid on ultrasound. Worried about cancer of the ovaries due to her history of prior breast cancer.  Pain is always there but waxes and wanes and is random. Whole lower abdomen can be painful. Nothing makes it better or worse.  Menses were heavy with passing tissue for the last three years.  Now not so heavy.  Rare clotting.   Saw GI and had colonoscopy with biopsy and results pending from yesterday.  Just finished a course of abx for 2 weeks for intestinal pain.  Took Amoxicillin and another antibiotic for the infection.   02/02/14 pelvic ultrasound - CLINICAL DATA: Lower abdominal pain, history of breast cancer  EXAM:  TRANSABDOMINAL AND TRANSVAGINAL ULTRASOUND OF PELVIS  TECHNIQUE:  Both transabdominal and transvaginal ultrasound examinations of the  pelvis were performed. Transabdominal technique was performed for  global imaging of the pelvis including uterus, ovaries, adnexal  regions, and pelvic cul-de-sac. It was necessary to proceed with  endovaginal exam following the transabdominal exam to visualize the  endometrium and bilateral adnexa.  COMPARISON: None  FINDINGS:  Uterus  Measurements: 12.8 x 5.7 x 7.4 cm. Subserosal fundal fibroid  measuring 3.2 x 2.2 x 1.9 cm, only visualized transabdominally.  Endometrium  Thickness: 7 mm. No focal abnormality visualized.  Right ovary  Not visualized due to overlying bowel gas.  Left ovary  Not visualized due to overlying bowel gas.  Other findings  No free fluid.  IMPRESSION:  Suspected 3.2 cm subserosal fibroid in the uterine fundus.  Bilateral ovaries are not discretely  visualized.  Electronically Signed  By: Julian Hy M.D.  On: 02/02/2014 14:08  Breast cancer was triple negative.  Treatment was at [facility] in 1998 just a few months after delivery - age 48 year old.  No genetic testing was done.   History of Cesarean section, laparoscopy, and cervical conization.   History of an IUD in the past.   GYNECOLOGIC HISTORY: No LMP recorded. Contraception:    Menopausal hormone therapy:         OB History   Grav Para Term Preterm Abortions TAB SAB Ect Mult Living   1 1 1       1          Patient Active Problem List   Diagnosis Date Noted  . Breast cancer 02/14/2014    Class: History of  . Chronic cough 05/27/2011  . Abnormal CXR 05/27/2011    Past Medical History  Diagnosis Date  . Allergic rhinitis   . Lymphedema     rt arm  . Breast cancer 1998    chemo & radiation rt side  . Abnormal Pap smear of cervix     as of 2015, it was over 20 yrs ago  . Migraines     in college  . Dyspareunia   . Fibroid   . Infertility, female   . Urinary incontinence     urine loss with coughing    Past Surgical History  Procedure Laterality Date  . Right lumpectomy  1998  . Cesarean section    . Cervical cone  biopsy      as of 2015, it was over 20 yrs ago  . Colposcopy      as of 2015, it was over 58yrs ago  . Exploratory laparotomy      Current Outpatient Prescriptions  Medication Sig Dispense Refill  . Acetaminophen (TYLENOL PO) Take by mouth as needed.      . IBUPROFEN PO Take by mouth as needed.      . loratadine (CLARITIN) 10 MG tablet Take 10 mg by mouth daily.        . Probiotic Product (PROBIOTIC PO) Take by mouth daily.       No current facility-administered medications for this visit.     ALLERGIES: Review of patient's allergies indicates no known allergies.  Family History  Problem Relation Age of Onset  . Allergies Father   . Diabetes Father   . Hypertension Father   . Lymphoma Brother   . Cancer Maternal  Grandfather   . Heart attack Paternal Grandfather 77    History   Social History  . Marital Status: Married    Spouse Name: N/A    Number of Children: N/A  . Years of Education: N/A   Occupational History  . homemaker    Social History Main Topics  . Smoking status: Never Smoker   . Smokeless tobacco: Never Used  . Alcohol Use: 3.6 - 4.8 oz/week    6-8 Glasses of wine per week     Comment: socially  . Drug Use: No  . Sexual Activity: Yes    Partners: Male    Birth Control/ Protection: Other-see comments     Comment: husband vasectomy   Other Topics Concern  . Not on file   Social History Narrative  . No narrative on file    ROS:  Pertinent items are noted in HPI.  PHYSICAL EXAMINATION:    BP 130/84  Pulse 84  Temp(Src) 98.6 F (37 C)  Wt 185 lb 9.6 oz (84.188 kg)     General appearance: alert, cooperative and appears stated age Lungs: clear to auscultation bilaterally Heart: regular rate and rhythm Abdomen: soft, non-tender; no masses,  no organomegaly No abnormal inguinal nodes palpated  Pelvic: External genitalia:  no lesions              Urethra:  normal appearing urethra with no masses, tenderness or lesions              Bartholins and Skenes: normal                 Vagina: normal appearing vagina with normal color and discharge, no lesions              Cervix: normal appearance                   Bimanual Exam:  Uterus:  uterus is normal size, shape, consistency and nontender                                      Adnexa: normal adnexa in size, nontender and no masses                                      Rectovaginal: Confirms  Anus:  normal sphincter tone, no lesions  ASSESSMENT  RLQ pain - uncertain etiology. 3.2 cm subserosal fundal fibroid. History of Cesarean Section and laparoscopy.  Status post recent antibiotics for intestinal infection.  History of premenopausal breast cancer.  PLAN  Urine culture.   GC/CT. Return for repeat pelvic ultrasound to evaluate ovaries. Referral to [facility] to have genetics counseling and testing for mutations.   An After Visit Summary was printed and given to the patient.  _25_____ minutes face to face time of which over 50% was spent in counseling.

## 2014-03-18 LAB — GC/CHLAMYDIA PROBE AMP, URINE
CHLAMYDIA, SWAB/URINE, PCR: NEGATIVE
GC Probe Amp, Urine: NEGATIVE

## 2014-03-20 ENCOUNTER — Telehealth: Payer: Self-pay | Admitting: Genetic Counselor

## 2014-03-20 NOTE — Telephone Encounter (Signed)
S/W PATIENT AND GAVE GENETIC APPT FOR 07/27 @ 9 W/GENETIC COUNSELOR.  REFERRING DR. Quincy Simmonds DX- HX OF BREAST CA WELCOME PACKET MAILED.

## 2014-03-21 ENCOUNTER — Telehealth: Payer: Self-pay | Admitting: Emergency Medicine

## 2014-03-21 ENCOUNTER — Other Ambulatory Visit: Payer: Self-pay | Admitting: Obstetrics and Gynecology

## 2014-03-21 ENCOUNTER — Encounter: Payer: Self-pay | Admitting: Obstetrics and Gynecology

## 2014-03-21 DIAGNOSIS — R1031 Right lower quadrant pain: Secondary | ICD-10-CM | POA: Insufficient documentation

## 2014-03-21 LAB — CULTURE, URINE COMPREHENSIVE

## 2014-03-21 MED ORDER — CIPROFLOXACIN HCL 500 MG PO TABS: 500.0000 mg | ORAL_TABLET | Freq: Two times a day (BID) | ORAL | Status: DC

## 2014-03-21 MED ORDER — NITROFURANTOIN MONOHYD MACRO 100 MG PO CAPS: 100.0000 mg | ORAL_CAPSULE | Freq: Two times a day (BID) | ORAL | Status: DC

## 2014-03-21 NOTE — Telephone Encounter (Signed)
Attempted to call x 2 on mobile per designated party release form. Both times calls were picked up then disconnected.

## 2014-03-21 NOTE — Telephone Encounter (Signed)
Patient is in waiting room with her daughter at this time. Brought to triage office and discussed results with patient. She is agreeable to medication and will call with any new symptoms.   She is scheduled for pelvic ultrasound with Dr. Quincy Simmonds on 04/06/14 and can do test of cure at that time as she lives out of town for the summer and does not want to come into [facility] twice.   Pelvic U/S scheduled and patient aware/agreeable to time.  Patient verbalized understanding of the U/S appointment cancellation policy. Advised will need to cancel within 72 business hours (3 business days) or will have $100.00 no show fee placed to account. Written instructions given.

## 2014-03-21 NOTE — Telephone Encounter (Signed)
Message copied by Michele Mcalpine on Tue Mar 21, 2014  9:23 AM ------      Message from: Purvis, BROOK E      Created: Tue Mar 21, 2014  7:27 AM       Please contact patient with her results from her urine culture.      She needs treatment with 2 antibiotics.      I will place the order.             Macrobid 100 mg po bid for 3 days.      Ciprofloxacin 500 mg po bid for 3 days.             She will need a test of cure in approximately 10 days. ------

## 2014-04-04 ENCOUNTER — Ambulatory Visit: Payer: BC Managed Care – PPO

## 2014-04-06 ENCOUNTER — Ambulatory Visit (INDEPENDENT_AMBULATORY_CARE_PROVIDER_SITE_OTHER): Payer: BC Managed Care – PPO

## 2014-04-06 ENCOUNTER — Ambulatory Visit (INDEPENDENT_AMBULATORY_CARE_PROVIDER_SITE_OTHER): Payer: BC Managed Care – PPO | Admitting: Obstetrics and Gynecology

## 2014-04-06 ENCOUNTER — Encounter: Payer: Self-pay | Admitting: Obstetrics and Gynecology

## 2014-04-06 VITALS — BP 138/90 | HR 80 | Ht 61.25 in | Wt 182.2 lb

## 2014-04-06 DIAGNOSIS — N921 Excessive and frequent menstruation with irregular cycle: Secondary | ICD-10-CM

## 2014-04-06 DIAGNOSIS — G8929 Other chronic pain: Secondary | ICD-10-CM

## 2014-04-06 DIAGNOSIS — R1031 Right lower quadrant pain: Secondary | ICD-10-CM

## 2014-04-06 DIAGNOSIS — N390 Urinary tract infection, site not specified: Secondary | ICD-10-CM

## 2014-04-06 NOTE — Patient Instructions (Signed)
Endometrial Biopsy Endometrial biopsy is a procedure in which a tissue sample is taken from inside the uterus. The tissue sample is then looked at under a microscope to see if the tissue is normal or abnormal. The endometrium is the lining of the uterus. This procedure helps determine where you are in your menstrual cycle and how hormone levels are affecting the lining of the uterus. This procedure may also be used to evaluate uterine bleeding or to diagnose endometrial cancer, tuberculosis, polyps, or inflammatory conditions.  LET YOUR HEALTH CARE Abdo Denault KNOW ABOUT:  Any allergies you have.  All medicines you are taking, including vitamins, herbs, eye drops, creams, and over-the-counter medicines.  Previous problems you or members of your family have had with the use of anesthetics.  Any blood disorders you have.  Previous surgeries you have had.  Medical conditions you have.  Possibility of pregnancy. RISKS AND COMPLICATIONS Generally, this is a safe procedure. However, as with any procedure, complications can occur. Possible complications include:  Bleeding.  Pelvic infection.  Puncture of the uterine wall with the biopsy device (rare). BEFORE THE PROCEDURE   Keep a record of your menstrual cycles as directed by your health care Dante Roudebush. You may need to schedule your procedure for a specific time in your cycle.  You may want to bring a sanitary pad to wear home after the procedure.  Arrange for someone to drive you home after the procedure if you will be given a medicine to help you relax (sedative). PROCEDURE   You may be given a sedative to relax you.  You will lie on an exam table with your feet and legs supported as in a pelvic exam.  Your health care Murl Zogg will insert an instrument (speculum) into your vagina to see your cervix.  Your cervix will be cleansed with an antiseptic solution. A medicine (local anesthetic) will be used to numb the cervix.  A forceps  instrument (tenaculum) will be used to hold your cervix steady for the biopsy.  A thin, rodlike instrument (uterine sound) will be inserted through your cervix to determine the length of your uterus and the location where the biopsy sample will be removed.  A thin, flexible tube (catheter) will be inserted through your cervix and into the uterus. The catheter is used to collect the biopsy sample from your endometrial tissue.  The catheter and speculum will then be removed, and the tissue sample will be sent to a lab for examination. AFTER THE PROCEDURE  You will rest in a recovery area until you are ready to go home.  You may have mild cramping and a small amount of vaginal bleeding for a few days after the procedure. This is normal.  Make sure you find out how to get your test results. Document Released: 01/30/2005 Document Revised: 06/01/2013 Document Reviewed: 03/16/2013 ExitCare Patient Information 2015 [employer]. This information is not intended to replace advice given to you by your health care Nyeli Holtmeyer. Make sure you discuss any questions you have with your health care Akirra Lacerda.  

## 2014-04-06 NOTE — Progress Notes (Signed)
Subjective  48 y.o. Married Caucasian female  [place] with LMP 03/09/14 who presents for ultrasound  for right lower quadrant abdominal pain.   Pain is still there and feels like bad menstrual cramping.  Had menses in May and in June 2015 Prior to this was January 2015. Has light spotting in between May and June menses.  Spotting again yesterday.   Had ultrasound with PCP which showed 3.2 cm subserosal fibroid and ovaries not seen. Worried about cancer of the ovaries due to her history of prior breast cancer.  Pain is always there but waxes and wanes and is random.  Whole lower abdomen can be painful.  Nothing makes it better or worse.  Just had a negative colonoscopy.  Breast cancer was triple negative.  Treatment was at [facility] in 1998 just a few months after delivery - age 48 year old.  No genetic testing was done.   History of Cesarean section, laparoscopy, and cervical conization.  History of an IUD in the past.  Vasectomy for birth control. Patient states no other partners.  Patient has a history of back pain.  This pain in the right lower quadrant feels differently.   Just treated for a UTI with EColi and Coagulase negative Staph - Cipro and Macrobid.  Doing a test of cure today.   Objective  See ultrasound below - Report and images reviewed with patient   Multifibroid uterus with 6 fibroids, the largest 3.2 cm at fundus - none impinging on cavity.   Endometrium 3.75 mm and thin. Ovaries normal and no free fluid.    Assessment  Right lower quadrant pain.  Multifibroid uterus. I do not think this is the cause of the patient's pain.  Metrorrhagia. Perimenopausal female.  History of premenopausal breast cancer - triple negative. Recent UTI.  History of Cesarean Section, laparoscopy, and cervical conization.  History of back pain.   Plan  Discussion of fibroids. Return for endometrial biopsy.  Urine culture test of cure.  We talked about the  multiple etiologies of RLQ pain including appendix, musculoskeletal, urinary tract pathology, endometriosis, adhesive disease.   Patient will return to her PCP for further evaluation. I discussed a possible laparoscopy for the patient if her evaluation is negative and her pain persists.  She understands that endometriosis and adhesive disease are findings that many times can be identified only through surgical intervention.    25 minutes face to face time of which over 50% was spent in counseling.   After visit summary to patient.

## 2014-04-07 LAB — URINE CULTURE

## 2014-04-18 ENCOUNTER — Telehealth: Payer: Self-pay | Admitting: Obstetrics and Gynecology

## 2014-04-18 NOTE — Telephone Encounter (Signed)
Patient is calling Audrey Conway back

## 2014-04-18 NOTE — Telephone Encounter (Signed)
Left message for patient to call back  

## 2014-04-18 NOTE — Telephone Encounter (Signed)
Left message for patient to call back. Need to go over benefits for EMB °

## 2014-05-08 ENCOUNTER — Ambulatory Visit (HOSPITAL_BASED_OUTPATIENT_CLINIC_OR_DEPARTMENT_OTHER): Payer: BC Managed Care – PPO | Admitting: Genetic Counselor

## 2014-05-08 ENCOUNTER — Other Ambulatory Visit: Payer: BC Managed Care – PPO

## 2014-05-08 ENCOUNTER — Encounter: Payer: Self-pay | Admitting: Genetic Counselor

## 2014-05-08 DIAGNOSIS — C50911 Malignant neoplasm of unspecified site of right female breast: Secondary | ICD-10-CM

## 2014-05-08 DIAGNOSIS — Z803 Family history of malignant neoplasm of breast: Secondary | ICD-10-CM

## 2014-05-08 DIAGNOSIS — IMO0002 Reserved for concepts with insufficient information to code with codable children: Secondary | ICD-10-CM

## 2014-05-08 DIAGNOSIS — C50919 Malignant neoplasm of unspecified site of unspecified female breast: Secondary | ICD-10-CM

## 2014-05-08 NOTE — Progress Notes (Signed)
Patient Name: Audrey Conway Patient Age: 48 y.o. Encounter Date: 05/08/2014  Referring Physician: Jamey Reas de Berton Lan, MD   Primary Care Provider: Antony Blackbird, MD   [name], a 48 y.o. female, is being seen at the [facility] due to a personal history of breast cancer.  She presents to clinic today to discuss the possibility of a hereditary predisposition to cancer and discuss whether genetic testing is warranted.  HISTORY OF PRESENT ILLNESS: Audrey Conway reported that she was diagnosed with right-breast cancer (IDC) at the age of 51. She had a lumpectomy, radiation and chemotherapy.  The breast tumor was ER negative, PR negative, and HER2 negative.  She has no other history of cancer. She reports having a yearly mammogram, clinical breast exam and gynecologic exam. A colonoscopy this year was negative for polyps.  Past Medical History  Diagnosis Date  . Allergic rhinitis   . Lymphedema     rt arm  . Breast cancer 1998    chemo & radiation rt side  . Abnormal Pap smear of cervix     as of 2015, it was over 20 yrs ago  . Migraines     in college  . Dyspareunia   . Fibroid   . Infertility, female   . Urinary incontinence     urine loss with coughing    Past Surgical History  Procedure Laterality Date  . Right lumpectomy  1998  . Cesarean section    . Cervical cone biopsy      as of 2015, it was over 20 yrs ago  . Colposcopy      as of 2015, it was over 28yr ago  . Exploratory laparotomy      History   Social History  . Marital Status: Married    Spouse Name: N/A    Number of Children: N/A  . Years of Education: N/A   Occupational History  . homemaker    Social History Main Topics  . Smoking status: Never Smoker   . Smokeless tobacco: Never Used  . Alcohol Use: 3.6 - 4.8 oz/week    6-8 Glasses of wine per week     Comment: socially  . Drug Use: No  . Sexual Activity: Yes    Partners: Male    Birth Control/  Protection: Other-see comments     Comment: husband vasectomy   Other Topics Concern  . Not on file   Social History Narrative  . No narrative on file     FAMILY HISTORY:   During the visit, a 4-generation pedigree was obtained. Audrey Conway one daughter (age 50. She has two brothers, one of whom has a history of NHL at 427 Her mother is 765and cancer-free, but did have a TAH/BSO premenopausally. Of her two maternal aunts, one was diagnosed with breast cancer in her late 689sand died at age 50  Her father is 79and cancer-free. He only had a single brother. Her paternal grandfather also had no sisters and only a single brother.   Audrey Conway is Caucasian - NOS. There is no known Jewish ancestry and no consanguinity.  ASSESSMENT AND PLAN: Audrey Conway a 48y.o. female with a personal history of triple negative breast cancer diagnosed at age 48 While her family history is not suggestive of a hereditary predisposition to cancer, her father and paternal grandfather had no sisters. Risk assessment is challenging when there is a paucity of women. We  reviewed the characteristics, features and inheritance patterns of hereditary cancer syndromes. We also discussed genetic testing, including the process of testing, insurance coverage and implications of results.   Audrey Conway wished to pursue genetic testing and a blood sample will be sent to [facility] for analysis of the 17 genes on the BreastNext gene panel. We discussed the implications of a positive, negative and/ or Variant of Uncertain Significance (VUS) result. Results should be available in approximately 4-5 weeks, at which point we will contact her and address implications for her as well as address genetic testing for at-risk family members, if needed.    We encouraged Audrey Conway to remain in contact with Cancer Genetics annually so that we can update the family history and inform her of any changes in cancer genetics  and testing that may be of benefit for this family. [name] questions were answered to her satisfaction today.   Thank you for the referral and allowing [place] to share in the care of your patient.   The patient was seen for a total of 30 minutes, greater than 50% of which was spent face-to-face counseling. This patient was discussed with the overseeing provider who agrees with the above.   Steele Berg, MS, [place] Certified Genetic Counseor phone: [phone] [email or web address]

## 2014-05-11 ENCOUNTER — Other Ambulatory Visit: Payer: Self-pay

## 2014-05-11 DIAGNOSIS — Z1231 Encounter for screening mammogram for malignant neoplasm of breast: Secondary | ICD-10-CM

## 2014-05-11 DIAGNOSIS — Z853 Personal history of malignant neoplasm of breast: Secondary | ICD-10-CM

## 2014-05-17 ENCOUNTER — Other Ambulatory Visit: Payer: Self-pay | Admitting: Family Medicine

## 2014-05-17 DIAGNOSIS — G8929 Other chronic pain: Secondary | ICD-10-CM

## 2014-05-17 DIAGNOSIS — R109 Unspecified abdominal pain: Principal | ICD-10-CM

## 2014-05-17 DIAGNOSIS — Z853 Personal history of malignant neoplasm of breast: Secondary | ICD-10-CM

## 2014-05-23 ENCOUNTER — Telehealth: Payer: Self-pay | Admitting: Obstetrics and Gynecology

## 2014-05-23 NOTE — Telephone Encounter (Signed)
Please contact the patient.  If patient continues to have irregular bleeding, I recommend proceeding with the endometrial biopsy. I understand that the CT scan will be helpful from the standpoint of her pain.  Thanks.

## 2014-05-23 NOTE — Telephone Encounter (Signed)
Spoke with patient and advised of message from Dr. Quincy Simmonds.  She states she is going to schedule her  endometrial biopsy but would like to schedule CT first as it was ordered by primary care yesterday. She will call back when ready to schedule.  Routing to provider for final review. Patient agreeable to disposition. Will close encounter

## 2014-05-23 NOTE — Telephone Encounter (Signed)
Spoke with patient. Advised that she will be responsible for $25 copay when she comes in for EMB. Patient wants to hold off on scheduling until her primary schedules her CAT scan. She will call back to schedule.

## 2014-05-24 ENCOUNTER — Ambulatory Visit
Admission: RE | Admit: 2014-05-24 | Discharge: 2014-05-24 | Disposition: A | Discharge status: Home / Self Care | Payer: BC Managed Care – PPO | Source: Ambulatory Visit

## 2014-05-24 DIAGNOSIS — Z853 Personal history of malignant neoplasm of breast: Secondary | ICD-10-CM

## 2014-05-24 DIAGNOSIS — Z1231 Encounter for screening mammogram for malignant neoplasm of breast: Secondary | ICD-10-CM

## 2014-05-30 ENCOUNTER — Ambulatory Visit
Admission: RE | Admit: 2014-05-30 | Discharge: 2014-05-30 | Disposition: A | Discharge status: Home / Self Care | Payer: BC Managed Care – PPO | Source: Ambulatory Visit | Attending: Family Medicine | Admitting: Family Medicine

## 2014-05-30 DIAGNOSIS — Z853 Personal history of malignant neoplasm of breast: Secondary | ICD-10-CM

## 2014-05-30 DIAGNOSIS — G8929 Other chronic pain: Secondary | ICD-10-CM

## 2014-05-30 DIAGNOSIS — R109 Unspecified abdominal pain: Principal | ICD-10-CM

## 2014-05-30 MED ORDER — IOHEXOL 300 MG/ML  SOLN
100.0000 mL | Freq: Once | INTRAMUSCULAR | Status: AC | PRN
  Administered 2014-05-30: 100 mL via INTRAVENOUS

## 2014-06-01 ENCOUNTER — Encounter: Payer: Self-pay | Admitting: Genetic Counselor

## 2014-06-01 NOTE — Progress Notes (Signed)
Referring Physician: Jamey Reas de Berton Lan, MD   Ms. Spirito was called today to discuss genetic test results. Please see the Genetics note from her visit on 05/08/14 for a detailed discussion of her personal and family history.  GENETIC TESTING: At the time of Ms. Violet's visit, we recommended she pursue genetic testing of multiple genes on the BreastNext gene panel. This test, which included sequencing and deletion/duplication analysis of 17 genes, was performed at [facility]. Testing was normal and did not reveal a mutation in these genes. The genes tested were ATM, BARD1, BRCA1, BRCA2, BRIP1, CDH1, CHEK2, MRE11A, MUTYH, NBN, NF1, PALB2, PTEN, RAD50, RAD51C, RAD51D, and TP53.  We discussed with Ms. Cosgriff that since the current test is not perfect, it is possible there may be a gene mutation that current testing cannot detect, but that chance is small. We also discussed that it is possible that a different genetic factor, which was not part of this testing or has not yet been discovered, is responsible for the cancer diagnoses in the family. Again, this chance is low given her family history.  CANCER SCREENING: This result suggests that Ms. Shutter's cancer was most likely not due to an inherited predisposition even though it occurred at a young age. Most cancers happen by chance and this negative test, along with details of her family history, suggests that her cancer falls into this category. We, therefore, recommended she continue to follow the cancer screening guidelines provided by her physician.   FAMILY MEMBERS: Women in the family are at some increased risk of developing breast cancer, over the general population risk, simply due to the family history. We recommended they have a yearly mammogram beginning in the mid-20s (due to her age at diagnosis), a yearly clinical breast exam, and perform monthly breast self-exams. A gynecologic exam is recommended yearly. Colon cancer  screening is recommended to begin by age 37.  Lastly, we discussed with Ms. Pata that cancer genetics is a rapidly advancing field and it is possible that new genetic tests will be appropriate for her in the future. We encouraged her to remain in contact with [place] on an annual basis so we can update her personal and family histories, and let her know of advances in cancer genetics that may benefit the family. Our contact number was provided. Ms. Sallis questions were answered to her satisfaction today, and she knows she is welcome to call anytime with additional questions.    Steele Berg, MS, [place] Certified Genetic Counseor phone: [phone] [email or web address]

## 2014-06-02 ENCOUNTER — Other Ambulatory Visit: Payer: Self-pay | Admitting: Urology

## 2014-06-05 ENCOUNTER — Encounter (HOSPITAL_COMMUNITY): Payer: Self-pay | Admitting: Pharmacist

## 2014-06-06 ENCOUNTER — Encounter (HOSPITAL_COMMUNITY): Payer: Self-pay | Admitting: General Practice

## 2014-06-12 ENCOUNTER — Encounter (HOSPITAL_COMMUNITY)
Admission: RE | Disposition: A | Discharge status: Home / Self Care | Payer: Self-pay | Source: Ambulatory Visit | Attending: Urology

## 2014-06-12 ENCOUNTER — Encounter (HOSPITAL_COMMUNITY): Payer: Self-pay

## 2014-06-12 ENCOUNTER — Ambulatory Visit (HOSPITAL_COMMUNITY)
Admission: RE | Admit: 2014-06-12 | Discharge: 2014-06-12 | Disposition: A | Discharge status: Home / Self Care | Payer: BC Managed Care – PPO | Source: Ambulatory Visit | Attending: Urology | Admitting: Urology

## 2014-06-12 ENCOUNTER — Ambulatory Visit (HOSPITAL_COMMUNITY): Payer: BC Managed Care – PPO

## 2014-06-12 DIAGNOSIS — Z881 Allergy status to other antibiotic agents status: Secondary | ICD-10-CM | POA: Insufficient documentation

## 2014-06-12 DIAGNOSIS — N200 Calculus of kidney: Secondary | ICD-10-CM | POA: Diagnosis not present

## 2014-06-12 HISTORY — DX: Calculus of kidney: N20.0

## 2014-06-12 LAB — PREGNANCY, URINE: Preg Test, Ur: NEGATIVE

## 2014-06-12 SURGERY — LITHOTRIPSY, ESWL
Anesthesia: LOCAL | Laterality: Right

## 2014-06-12 MED ORDER — CIPROFLOXACIN HCL 500 MG PO TABS
500.0000 mg | ORAL_TABLET | ORAL | Status: AC
  Administered 2014-06-12: 500 mg via ORAL
  Filled 2014-06-12: qty 1

## 2014-06-12 MED ORDER — DEXTROSE-NACL 5-0.45 % IV SOLN
INTRAVENOUS | Status: DC
  Administered 2014-06-12: 12:00:00 via INTRAVENOUS

## 2014-06-12 MED ORDER — DIPHENHYDRAMINE HCL 25 MG PO CAPS
25.0000 mg | ORAL_CAPSULE | ORAL | Status: AC
  Administered 2014-06-12: 25 mg via ORAL
  Filled 2014-06-12: qty 1

## 2014-06-12 MED ORDER — DIAZEPAM 5 MG PO TABS
10.0000 mg | ORAL_TABLET | ORAL | Status: AC
Start: 2014-06-12 — End: 2014-06-12
  Administered 2014-06-12: 10 mg via ORAL
  Filled 2014-06-12: qty 2

## 2014-06-12 MED ORDER — HYDROCODONE-ACETAMINOPHEN 5-325 MG PO TABS
1.0000 | ORAL_TABLET | ORAL | Status: DC | PRN
  Administered 2014-06-12: 1 via ORAL
  Filled 2014-06-12: qty 1

## 2014-06-12 NOTE — H&P (Signed)
History of Present Illness Audrey Conway is referred by Dr Antony Blackbird for evaluation and management of a right pelvis calculus. She has been complaining of abdominal pain since the end of April. It was initially thought it was related to her GI tract and work-up including colonoscopy was negative. The pain feels that it is bad menstrual cramps sometimes and is in the suprapubic area and when severe it is on the right side. It is associated with nausea. Gynecologic evaluation was unremarkable except for uterine fibroids. A CT scan yesterday yesterday revealed a 9 mm right renal pelvis calculus with mild to moderate hydronephrosis and proximal hydroureter with thickening of the ureteral wall. There is no stone on the side nor in the ureters. She does not smoke. She does not have a history of kidney stone. Her mother has had several kidney stones.   Past Medical History Problems  1. History of breast cancer (V10.3)  Surgical History Problems  1. History of Breast Surgery Lumpectomy 2. History of Cesarean Section 3. History of Laparoscopy (Diagnostic)  Current Meds 1. Advil CAPS;  Therapy: (Recorded:20Aug2015) to Recorded 2. Probiotic CAPS;  Therapy: (Recorded:20Aug2015) to Recorded 3. Tylenol TABS;  Therapy: (Recorded:20Aug2015) to Recorded  Allergies Medication  1. Doxycycline Hyclate CAPS  Family History Problems  1. Family history of diabetes mellitus (V18.0) : Father 2. Family history of hypertension (V17.49) : Father, Brother 3. Family history of non-Hodgkin's lymphoma (V16.7) : Brother  Social History Problems  1. Alcohol use (V49.89) 2. Caffeine use (V49.89) 3. Married 4. Never a smoker  Review of Systems Genitourinary, constitutional, skin, eye, otolaryngeal, hematologic/lymphatic, cardiovascular, pulmonary, endocrine, musculoskeletal, gastrointestinal, neurological and psychiatric system(s) were reviewed and pertinent findings if present are noted.  Genitourinary:  nocturia, incontinence and hematuria.  Gastrointestinal: nausea, heartburn and diarrhea.  Constitutional: night sweats.  ENT: sinus problems.  Musculoskeletal: back pain.    Vitals Vital Signs [Data Includes: Last 1 Day]  Recorded: 20Aug2015 03:29PM  Height: 5 ft 1.25 in Weight: 182 lb  BMI Calculated: 34.11 BSA Calculated: 1.82 Blood Pressure: 148 / 90 Temperature: 98 F Heart Rate: 90  Physical Exam Constitutional: Well nourished and well developed . No acute distress.  ENT:. The ears and nose are normal in appearance.  Neck: The appearance of the neck is normal and no neck mass is present.  Pulmonary: No respiratory distress and normal respiratory rhythm and effort.  Cardiovascular: Heart rate and rhythm are normal . No peripheral edema.  Abdomen: The abdomen is soft and nontender. No masses are palpated. No CVA tenderness. No hernias are palpable. No hepatosplenomegaly noted.  Genitourinary:  The bladder is non tender and not distended.  Lymphatics: The femoral and inguinal nodes are not enlarged or tender.  Skin: Normal skin turgor, no visible rash and no visible skin lesions.  Neuro/Psych:. Mood and affect are appropriate.    Results/Data Urine [Data Includes: Last 1 Day]   20Aug2015  COLOR YELLOW   APPEARANCE CLEAR   SPECIFIC GRAVITY 1.025   pH 5.5   GLUCOSE NEG mg/dL  BILIRUBIN NEG   KETONE NEG mg/dL  BLOOD LARGE   PROTEIN TRACE mg/dL  UROBILINOGEN 0.2 mg/dL  NITRITE NEG   LEUKOCYTE ESTERASE NEG   SQUAMOUS EPITHELIAL/HPF FEW   WBC 0-2 WBC/hpf  RBC 7-10 RBC/hpf  BACTERIA RARE   CRYSTALS NONE SEEN   CASTS NONE SEEN   Other MUCUS    I independently reviewed the CT scan with the patient and the findings are as noted above.  Assessment Assessed  1. Right nephrolithiasis (592.0)  Plan Health Maintenance  1. UA With REFLEX; [Do Not Release]; Status:Resulted - Requires Verification;   Done:  20Aug2015 03:00PM Nephrolithiasis  2. Start:  Hydrocodone-Acetaminophen 5-325 MG Oral Tablet; TAKE 1 TABLET Every  4 hours 3. Follow-up Schedule Surgery Office  Follow-up  Status: Complete  Done: 20Aug2015  I went over the treatment options with her: ESL, ureteroscopy, PCNL. I told her that ESL is the least invasive of those procedures. The risks, benefits of ESL include but are not limited to hemorrhage, renal or perirenal hematoma, inability to fragment the stone, steinstrasse. She understands and wishes to proceed.

## 2014-06-12 NOTE — Discharge Instructions (Signed)
Please follow the instructions on the [facility] discharge sheet.

## 2014-06-13 HISTORY — PX: LITHOTRIPSY: SUR834

## 2014-08-14 ENCOUNTER — Encounter (HOSPITAL_COMMUNITY): Payer: Self-pay

## 2015-02-20 ENCOUNTER — Ambulatory Visit: Payer: BC Managed Care – PPO | Admitting: Certified Nurse Midwife

## 2015-02-27 ENCOUNTER — Telehealth: Payer: Self-pay | Admitting: Certified Nurse Midwife

## 2015-02-27 NOTE — Telephone Encounter (Signed)
Left message on voicemail to call and reschedule cancelled appointment. °

## 2015-03-15 ENCOUNTER — Ambulatory Visit (INDEPENDENT_AMBULATORY_CARE_PROVIDER_SITE_OTHER): Payer: BLUE CROSS/BLUE SHIELD | Admitting: Nurse Practitioner

## 2015-03-15 ENCOUNTER — Encounter: Payer: Self-pay | Admitting: Nurse Practitioner

## 2015-03-15 VITALS — BP 148/96 | HR 88 | Resp 16 | Ht 61.5 in | Wt 184.0 lb

## 2015-03-15 DIAGNOSIS — Z01419 Encounter for gynecological examination (general) (routine) without abnormal findings: Secondary | ICD-10-CM | POA: Diagnosis not present

## 2015-03-15 NOTE — Progress Notes (Signed)
49 y.o. G67P1001 Married  Caucasian Fe here for annual exam.  Amenorrhea since last June - no vaginal no bleeding or spotting.  Initially vaso symptoms were a lot worse - now more tolerable. Since last here had Lithotripsy on the right last August or September.  To evaluate the abdominal pain she also had other colonoscopy and PUS to determine cause of pain and CT showed renal calculi on the right.  She is well now.  Patient's last menstrual period was 03/13/2014.          Sexually active: Yes.    The current method of family planning is vasectomy.    Exercising: Yes.    Walk Smoker:  no  Health Maintenance: Pap: 02/14/14 Neg. HR HPV:neg. Hx of abnormal pap's MMG: 05/25/14 BIRADS1:Neg Self Breast Exam: yes, monthly  Colonoscopy: 03/2014 Normal - repeat 10 years  TDaP:  PCP Labs: PCP   reports that she has never smoked. She has never used smokeless tobacco. She reports that she drinks about 2.4 oz of alcohol per week. She reports that she does not use illicit drugs.  Past Medical History  Diagnosis Date  . Allergic rhinitis   . Lymphedema     rt arm  . Breast cancer 1998    chemo & radiation rt side  . Abnormal Pap smear of cervix     as of 2015, it was over 20 yrs ago  . Migraines     in college  . Dyspareunia   . Fibroid   . Infertility, female   . Urinary incontinence     urine loss with coughing  . Kidney stones     Past Surgical History  Procedure Laterality Date  . Right lumpectomy  1998  . Cesarean section    . Cervical cone biopsy      as of 2015, it was over 20 yrs ago  . Colposcopy      as of 2015, it was over 9yrs ago  . Exploratory laparotomy    . Lithotripsy  06/2014    Current Outpatient Prescriptions  Medication Sig Dispense Refill  . acetaminophen (TYLENOL) 500 MG tablet Take 500 mg by mouth every 6 (six) hours as needed.    Marland Kitchen ibuprofen (ADVIL,MOTRIN) 200 MG tablet Take 200 mg by mouth every 6 (six) hours as needed.     No current facility-administered  medications for this visit.    Family History  Problem Relation Age of Onset  . Allergies Father   . Diabetes Father   . Hypertension Father   . Lymphoma Brother   . Cancer Maternal Grandfather   . Heart attack Paternal Grandfather 26  . Cancer Maternal Aunt     breast cancer @ 30; deceased 58    ROS:  Pertinent items are noted in HPI.  Otherwise, a comprehensive ROS was negative.  Exam:   BP 148/96 mmHg  Pulse 88  Resp 16  Ht 5' 1.5" (1.562 m)  Wt 184 lb (83.462 kg)  BMI 34.21 kg/m2  LMP 03/13/2014 Height: 5' 1.5" (156.2 cm) Ht Readings from Last 3 Encounters:  03/15/15 5' 1.5" (1.562 m)  06/06/14 5\' 2"  (1.575 m)  04/06/14 5' 1.25" (1.556 m)    General appearance: alert, cooperative and appears stated age Head: Normocephalic, without obvious abnormality, atraumatic Neck: no adenopathy, supple, symmetrical, trachea midline and thyroid normal to inspection and palpation Lungs: clear to auscultation bilaterally Breasts: normal appearance, no masses or tenderness, surgical and radiation changes on the right. Heart:  regular rate and rhythm Abdomen: soft, non-tender; no masses,  no organomegaly Extremities: extremities normal, atraumatic, no cyanosis or edema Skin: Skin color, texture, turgor normal. No rashes or lesions Lymph nodes: Cervical, supraclavicular, and axillary nodes normal. No abnormal inguinal nodes palpated Neurologic: Grossly normal   Pelvic: External genitalia:  no lesions              Urethra:  normal appearing urethra with no masses, tenderness or lesions              Bartholin's and Skene's: normal                 Vagina: normal appearing vagina with normal color and discharge, no lesions              Cervix: anteverted              Pap taken: No. Bimanual Exam:  Uterus:  normal size, contour, position, consistency, mobility, non-tender              Adnexa: no mass, fullness, tenderness               Rectovaginal: Confirms               Anus:   normal sphincter tone, no lesions  Chaperone present:/No  A:  Well Woman with normal exam  Menopausal with amenorrhea since 03/13/2014 History of right breast cancer triple negative,lumpectomy,chemotherapy and radiation age 35, with all axillary lymph node removal, history of lymphedema from surgery Uterine fibroid per PUS with uterine enlargement Vaginal dryness   P:   Reviewed health and wellness pertinent to exam  Pap smear as above  Mammogram is due 05/2015  She will report any vaginal bleeding or spotting  Counseled on breast self exam, mammography screening, adequate intake of calcium and vitamin D, diet and exercise return annually or prn  An After Visit Summary was printed and given to the patient.

## 2015-03-15 NOTE — Patient Instructions (Addendum)

## 2015-03-18 NOTE — Progress Notes (Signed)
Encounter reviewed by Dr. Brook Silva.  

## 2015-04-27 ENCOUNTER — Ambulatory Visit: Payer: Self-pay | Admitting: Certified Nurse Midwife

## 2015-11-08 ENCOUNTER — Other Ambulatory Visit: Payer: Self-pay

## 2015-11-08 DIAGNOSIS — Z1231 Encounter for screening mammogram for malignant neoplasm of breast: Secondary | ICD-10-CM

## 2015-11-09 ENCOUNTER — Ambulatory Visit
Admission: RE | Admit: 2015-11-09 | Discharge: 2015-11-09 | Disposition: A | Discharge status: Home / Self Care | Payer: BLUE CROSS/BLUE SHIELD | Source: Ambulatory Visit

## 2015-11-09 DIAGNOSIS — Z1231 Encounter for screening mammogram for malignant neoplasm of breast: Secondary | ICD-10-CM

## 2015-11-23 ENCOUNTER — Other Ambulatory Visit: Payer: Self-pay | Admitting: Internal Medicine

## 2015-11-23 DIAGNOSIS — R1013 Epigastric pain: Secondary | ICD-10-CM

## 2015-12-05 ENCOUNTER — Ambulatory Visit
Admission: RE | Admit: 2015-12-05 | Discharge: 2015-12-05 | Disposition: A | Discharge status: Home / Self Care | Payer: BLUE CROSS/BLUE SHIELD | Source: Ambulatory Visit | Attending: Internal Medicine | Admitting: Internal Medicine

## 2015-12-05 DIAGNOSIS — R1013 Epigastric pain: Secondary | ICD-10-CM

## 2016-06-30 ENCOUNTER — Other Ambulatory Visit: Payer: Self-pay | Admitting: General Surgery

## 2016-07-28 ENCOUNTER — Encounter (HOSPITAL_COMMUNITY)
Admission: RE | Admit: 2016-07-28 | Discharge: 2016-07-28 | Disposition: A | Discharge status: Home / Self Care | Payer: BLUE CROSS/BLUE SHIELD | Source: Ambulatory Visit | Attending: General Surgery | Admitting: General Surgery

## 2016-07-28 ENCOUNTER — Encounter (HOSPITAL_COMMUNITY): Payer: Self-pay

## 2016-07-28 DIAGNOSIS — Z79899 Other long term (current) drug therapy: Secondary | ICD-10-CM | POA: Diagnosis not present

## 2016-07-28 DIAGNOSIS — E7800 Pure hypercholesterolemia, unspecified: Secondary | ICD-10-CM | POA: Diagnosis not present

## 2016-07-28 DIAGNOSIS — K8020 Calculus of gallbladder without cholecystitis without obstruction: Secondary | ICD-10-CM | POA: Diagnosis present

## 2016-07-28 DIAGNOSIS — Z7982 Long term (current) use of aspirin: Secondary | ICD-10-CM | POA: Diagnosis not present

## 2016-07-28 DIAGNOSIS — K8010 Calculus of gallbladder with chronic cholecystitis without obstruction: Secondary | ICD-10-CM | POA: Diagnosis not present

## 2016-07-28 DIAGNOSIS — Z853 Personal history of malignant neoplasm of breast: Secondary | ICD-10-CM | POA: Diagnosis not present

## 2016-07-28 DIAGNOSIS — K219 Gastro-esophageal reflux disease without esophagitis: Secondary | ICD-10-CM | POA: Diagnosis not present

## 2016-07-28 HISTORY — DX: Hyperlipidemia, unspecified: E78.5

## 2016-07-28 HISTORY — DX: Gastro-esophageal reflux disease without esophagitis: K21.9

## 2016-07-28 HISTORY — DX: Personal history of urinary calculi: Z87.442

## 2016-07-28 LAB — CBC WITH DIFFERENTIAL/PLATELET
Basophils Absolute: 0 10*3/uL (ref 0.0–0.1)
Basophils Relative: 1 %
Eosinophils Absolute: 0.2 10*3/uL (ref 0.0–0.7)
Eosinophils Relative: 3 %
HCT: 41.6 % (ref 36.0–46.0)
HEMOGLOBIN: 14.1 g/dL (ref 12.0–15.0)
LYMPHS ABS: 1.3 10*3/uL (ref 0.7–4.0)
LYMPHS PCT: 24 %
MCH: 32.1 pg (ref 26.0–34.0)
MCHC: 33.9 g/dL (ref 30.0–36.0)
MCV: 94.8 fL (ref 78.0–100.0)
Monocytes Absolute: 0.3 10*3/uL (ref 0.1–1.0)
Monocytes Relative: 6 %
NEUTROS PCT: 66 %
Neutro Abs: 3.5 10*3/uL (ref 1.7–7.7)
Platelets: 257 10*3/uL (ref 150–400)
RBC: 4.39 MIL/uL (ref 3.87–5.11)
RDW: 12.3 % (ref 11.5–15.5)
WBC: 5.3 10*3/uL (ref 4.0–10.5)

## 2016-07-28 LAB — COMPREHENSIVE METABOLIC PANEL
ALT: 138 U/L — AB (ref 14–54)
AST: 29 U/L (ref 15–41)
Albumin: 4.1 g/dL (ref 3.5–5.0)
Alkaline Phosphatase: 100 U/L (ref 38–126)
Anion gap: 7 (ref 5–15)
BUN: 18 mg/dL (ref 6–20)
CALCIUM: 9.6 mg/dL (ref 8.9–10.3)
CO2: 26 mmol/L (ref 22–32)
Chloride: 108 mmol/L (ref 101–111)
Creatinine, Ser: 1.08 mg/dL — ABNORMAL HIGH (ref 0.44–1.00)
GFR, EST NON AFRICAN AMERICAN: 58 mL/min — AB (ref >60)
Glucose, Bld: 127 mg/dL — ABNORMAL HIGH (ref 65–99)
Potassium: 3.7 mmol/L (ref 3.5–5.1)
Sodium: 141 mmol/L (ref 135–145)
Total Bilirubin: 0.7 mg/dL (ref 0.3–1.2)
Total Protein: 6.7 g/dL (ref 6.5–8.1)

## 2016-07-28 NOTE — Progress Notes (Addendum)
Anesthesia Chart Review:  Pt is a 50 year old female scheduled for laparoscopic cholecystecomy with possible intraoperative cholangiogram on 07/30/2016 with Stark Klein, MD.   PMH includes:  Hyperlipidemia, lymphedema (R arm), breast cancer, GERD.  Never smoker. BMI 33  Medications include: ASA, lipitor  Preoperative labs reviewed.  ALT 138. Other LFTs normal. I routed the CMET to Dr. Barry Dienes for review.   If no changes, I anticipate pt can proceed with surgery as scheduled.   Willeen Cass, FNP-BC [facility]/Anesthesiology Phone: [phone] 07/29/2016 12:53 PM

## 2016-07-28 NOTE — Pre-Procedure Instructions (Signed)
    Vera Geiger Padovano  07/28/2016      [facility] [id number] - [place], [place] AT [facility] OF [employer] [address] [place] [address] Phone: [phone] Fax: [phone]    Your procedure is scheduled on 07-30-2016   Wednesday .  Report to [facility] Admitting at 9:30A.M.   Call this number if you have problems the morning of surgery:  [phone]   Remember:  Do not eat food or drink liquids after midnight.   Take these medicines the morning of surgery with A SIP OF WATER Tylenol if needed,Atorvastatin(Lipitor),         STOP ASPIRIN,ANTIINFLAMATORIES (IBUPROFEN,ALEVE,MOTRIN,ADVIL,GOODY'S POWDERS),HERBAL SUPPLEMENTS,FISH OIL,AND VITAMINS 5-7 DAYS PRIOR TO SURGERY   Do not wear jewelry, make-up or nail polish.  Do not wear lotions, powders, or perfumes, or deoderant.  Do not shave 48 hours prior to surgery.     Do not bring valuables to the hospital.  [facility] is not responsible for any belongings or valuables.  Contacts, dentures or bridgework may not be worn into surgery.  Leave your suitcase in the car.  After surgery it may be brought to your room.  For patients admitted to the hospital, discharge time will be determined by your treatment team.  Patients discharged the day of surgery will not be allowed to drive home.    Special instructions:  See attached Sheet for instructions on CHG showers  .

## 2016-07-29 NOTE — H&P (Signed)
[place]. [place] 06/30/2016 10:24 AM Location: [facility] Patient #: [id number] DOB: 07/11/1966 Married / Language: Cleophus Molt / Race: White Female   History of Present Illness Stark Klein MD; 06/30/2016 11:15 AM) Patient words: Gallbladder.  The patient is a 50 year old female who presents for evaluation of gall stones. Pt is a 50 yo F who is referred for consultation at the request of Dr. Penelope Coop for gallstones. She found out she had these in February of this year, but states that she had attacks of becoming violently ill going back to October of last year. She describes these attacks as severe nausea and vomiting with pain radiating to the mid back. She had first thought that she had a gastroenteritis type viral illness, but once they were the same and occurred multiple times, she felt like they may be something different. She did not have any attacks from February until the summer, but while she was on vacation at [place] she had a significant attack that left her with right upper quadrant soreness for around a week. She did not have severe pain at that time, but more bothersome and uncomfortable pain. She could not find anything that improved or worsened these attacks. She has tried to avoid fatty and greasy foods since discussing this with Dr. Penelope Coop. She has no family history of gallbladder disease of which she is aware. She denies fevers or jaundice associated with the attacks. She also complains of some bowel irregularity with the attacks. At this point, she has some vague chronic discomfort in the right upper quadrant pain that she describes as a heavy feeling.  Abd u/s 12/05/15 IMPRESSION: 1. Multiple gallstones are noted filling the gallbladder the largest measures 8.5 mm. Thickening of gallbladder wall up to 4.8 mm. No sonographic Murphy's sign. 2. Normal CBD. 3. There is increased echogenicity of the liver suspicious for fatty infiltration. Fatty sparing noted adjacent  to gallbladder fossa. 4. No hydronephrosis. There is a cyst in lower pole of the right kidney measures 3.6 x 3.1 cm. 5. No aortic aneurysm.    Other Problems Patsey Berthold, [place]; 06/30/2016 10:24 AM) Back Pain Breast Cancer Cholelithiasis Hypercholesterolemia Kidney Stone Lump In Breast  Past Surgical History Patsey Berthold, [place]; 06/30/2016 10:24 AM) Breast Biopsy Right. Breast Mass; Local Excision Right. Cesarean Section - 1 Oral Surgery Sentinel Lymph Node Biopsy  Diagnostic Studies History Patsey Berthold, [place]; 06/30/2016 10:24 AM) Colonoscopy 1-5 years ago Mammogram within last year Pap Smear 1-5 years ago  Allergies Patsey Berthold, CMA; 06/30/2016 10:25 AM) Dicloxacillin Sodium *PENICILLINS* Itching.  Medication History Patsey Berthold, CMA; 06/30/2016 10:26 AM) Atorvastatin Calcium (20MG  Tablet, Oral) Active. Vitamin D (Ergocalciferol) (50000UNIT Capsule, Oral) Active. Probiotic Formula (Oral) Active. Aspirin (325MG  Tablet, Oral) Active. CoQ10 (Oral) Specific dose unknown - Active. Fish Oil (1000MG  Capsule, Oral) Active. Medications Reconciled  Social History Patsey Berthold, CMA; 06/30/2016 10:24 AM) Alcohol use Moderate alcohol use. Caffeine use Coffee, Tea. No drug use Tobacco use Never smoker.  Family History Patsey Berthold, [place]; 06/30/2016 10:24 AM) Arthritis Mother. Cancer Brother. Hypertension Brother, Father, Mother.  Pregnancy / Birth History Patsey Berthold, [place]; 06/30/2016 10:24 AM) Age at menarche 79 years. Age of menopause 83-50 Contraceptive History Oral contraceptives. Gravida 1 Irregular periods Length (months) of breastfeeding 7-12 Maternal age 77-35 Para 1    Review of Systems Patsey Berthold CMA; 06/30/2016 10:24 AM) General Present- Night Sweats. Not Present- Appetite Loss, Chills, Fatigue, Fever, Weight Gain and Weight Loss. HEENT Present- Seasonal Allergies and Wears glasses/contact  lenses. Not Present-  Earache, Hearing Loss, Hoarseness, Nose Bleed, Oral Ulcers, Ringing in the Ears, Sinus Pain, Sore Throat, Visual Disturbances and Yellow Eyes. Respiratory Present- Snoring. Not Present- Bloody sputum, Chronic Cough, Difficulty Breathing and Wheezing. Cardiovascular Not Present- Chest Pain, Difficulty Breathing Lying Down, Leg Cramps, Palpitations, Rapid Heart Rate, Shortness of Breath and Swelling of Extremities. Gastrointestinal Present- Abdominal Pain, Bloating, Change in Bowel Habits, Chronic diarrhea, Hemorrhoids, Indigestion, Nausea and Vomiting. Not Present- Bloody Stool, Constipation, Difficulty Swallowing, Excessive gas, Gets full quickly at meals and Rectal Pain. Female Genitourinary Not Present- Frequency, Nocturia, Painful Urination, Pelvic Pain and Urgency. Musculoskeletal Present- Back Pain and Joint Pain. Not Present- Joint Stiffness, Muscle Pain, Muscle Weakness and Swelling of Extremities. Neurological Not Present- Decreased Memory, Fainting, Headaches, Numbness, Seizures, Tingling, Tremor, Trouble walking and Weakness. Endocrine Present- Hot flashes. Not Present- Cold Intolerance, Excessive Hunger, Hair Changes, Heat Intolerance and New Diabetes.  Vitals Patsey Berthold CMA; 06/30/2016 10:26 AM) 06/30/2016 10:26 AM Weight: 179.6 lb Height: 61in Height was reported by patient. Body Surface Area: 1.8 m Body Mass Index: 33.93 kg/m  Temp.: 98.38F  Pulse: 100 (Regular)  BP: 138/88 (Sitting, Left Arm, Standard)       Physical Exam Stark Klein MD; 06/30/2016 11:14 AM) General Mental Status-Alert. General Appearance-Consistent with stated age. Hydration-Well hydrated. Voice-Normal.  Head and Neck Head-normocephalic, atraumatic with no lesions or palpable masses. Trachea-midline. Thyroid Gland Characteristics - normal size and consistency.  Eye Eyeball - Bilateral-Extraocular movements intact. Sclera/Conjunctiva - Bilateral-No scleral  icterus.  Chest and Lung Exam Chest and lung exam reveals -quiet, even and easy respiratory effort with no use of accessory muscles. Inspection Chest Wall - Normal. Back - normal.  Cardiovascular Cardiovascular examination reveals -normal pedal pulses bilaterally.  Abdomen Inspection Inspection of the abdomen reveals - No Hernias. Palpation/Percussion Palpation and Percussion of the abdomen reveal - Soft, No Rebound tenderness, No Rigidity (guarding) and No hepatosplenomegaly. Note: mild tenderness right upper quadrant. Auscultation Auscultation of the abdomen reveals - Bowel sounds normal.  Neurologic Neurologic evaluation reveals -alert and oriented x 3 with no impairment of recent or remote memory. Mental Status-Normal.  Musculoskeletal Global Assessment -Note: no gross deformities.  Normal Exam - Left-Upper Extremity Strength Normal and Lower Extremity Strength Normal. Normal Exam - Right-Upper Extremity Strength Normal and Lower Extremity Strength Normal.  Lymphatic Head & Neck  General Head & Neck Lymphatics: Bilateral - Description - Normal. Axillary  General Axillary Region: Bilateral - Description - Normal. Tenderness - Non Tender. Femoral & Inguinal  Generalized Femoral & Inguinal Lymphatics: Bilateral - Description - No Generalized lymphadenopathy.    Assessment & Plan Stark Klein MD; 06/30/2016 11:16 AM) CHRONIC CHOLECYSTITIS WITH CALCULUS (K80.10) Impression: Patient has classic symptoms of chronic cholecystitis with biliary colic.  I do recommend laparoscopic cholecystectomy while the patient has mild symptoms. The surgical procedure was described to the patient in detail. The patient was given educational material. I discussed the incision type and location, the location of the gallbladder, the anatomy of the bile ducts and arteries, and the typical progression of surgery. I discussed the possibility of converting to an open operation. I  advised of the risks of bleeding, infection, damage to other structures (such as the bile duct, intestine or liver), bile leak, need for other procedures or surgeries, and post op diarrhea/constipation. We discussed the risk of blood clot. We discussed the recovery period and post operative restrictions. The patient was advised against taking blood thinners the week before surgery. Current Plans Pt  Education - Laparoscopic Cholecystectomy: gallbladder You are being scheduled for surgery - Our schedulers will call you.  You should hear from our office's scheduling department within 5 working days about the location, date, and time of surgery. We try to make accommodations for patient's preferences in scheduling surgery, but sometimes the OR schedule or the surgeon's schedule prevents [place] from making those accommodations.  If you have not heard from our office [phone]) in 5 working days, call the office and ask for your surgeon's nurse.  If you have other questions about your diagnosis, plan, or surgery, call the office and ask for your surgeon's nurse.    Signed by Stark Klein, MD (06/30/2016 11:16 AM)

## 2016-07-30 ENCOUNTER — Ambulatory Visit (HOSPITAL_COMMUNITY): Payer: BLUE CROSS/BLUE SHIELD | Admitting: Anesthesiology

## 2016-07-30 ENCOUNTER — Ambulatory Visit (HOSPITAL_COMMUNITY): Payer: BLUE CROSS/BLUE SHIELD | Admitting: Emergency Medicine

## 2016-07-30 ENCOUNTER — Ambulatory Visit (HOSPITAL_COMMUNITY)
Admission: RE | Admit: 2016-07-30 | Discharge: 2016-07-30 | Disposition: A | Discharge status: Home / Self Care | Payer: BLUE CROSS/BLUE SHIELD | Source: Ambulatory Visit | Attending: General Surgery | Admitting: General Surgery

## 2016-07-30 ENCOUNTER — Encounter (HOSPITAL_COMMUNITY): Payer: Self-pay | Admitting: *Deleted

## 2016-07-30 ENCOUNTER — Ambulatory Visit (HOSPITAL_COMMUNITY): Payer: BLUE CROSS/BLUE SHIELD

## 2016-07-30 ENCOUNTER — Encounter (HOSPITAL_COMMUNITY)
Admission: RE | Disposition: A | Discharge status: Home / Self Care | Payer: Self-pay | Source: Ambulatory Visit | Attending: General Surgery

## 2016-07-30 DIAGNOSIS — Z853 Personal history of malignant neoplasm of breast: Secondary | ICD-10-CM | POA: Insufficient documentation

## 2016-07-30 DIAGNOSIS — K8010 Calculus of gallbladder with chronic cholecystitis without obstruction: Secondary | ICD-10-CM | POA: Diagnosis not present

## 2016-07-30 DIAGNOSIS — K219 Gastro-esophageal reflux disease without esophagitis: Secondary | ICD-10-CM | POA: Insufficient documentation

## 2016-07-30 DIAGNOSIS — Z79899 Other long term (current) drug therapy: Secondary | ICD-10-CM | POA: Insufficient documentation

## 2016-07-30 DIAGNOSIS — E7800 Pure hypercholesterolemia, unspecified: Secondary | ICD-10-CM | POA: Insufficient documentation

## 2016-07-30 DIAGNOSIS — Z419 Encounter for procedure for purposes other than remedying health state, unspecified: Secondary | ICD-10-CM

## 2016-07-30 DIAGNOSIS — Z7982 Long term (current) use of aspirin: Secondary | ICD-10-CM | POA: Insufficient documentation

## 2016-07-30 HISTORY — PX: CHOLECYSTECTOMY: SHX55

## 2016-07-30 SURGERY — LAPAROSCOPIC CHOLECYSTECTOMY WITH INTRAOPERATIVE CHOLANGIOGRAM
Anesthesia: General | Site: Abdomen

## 2016-07-30 MED ORDER — ONDANSETRON HCL 4 MG/2ML IJ SOLN
INTRAMUSCULAR | Status: AC
  Filled 2016-07-30: qty 2

## 2016-07-30 MED ORDER — SUGAMMADEX SODIUM 200 MG/2ML IV SOLN
INTRAVENOUS | Status: AC
  Filled 2016-07-30: qty 2

## 2016-07-30 MED ORDER — CHLORHEXIDINE GLUCONATE CLOTH 2 % EX PADS: 6.0000 | MEDICATED_PAD | Freq: Once | CUTANEOUS | Status: DC

## 2016-07-30 MED ORDER — FENTANYL CITRATE (PF) 100 MCG/2ML IJ SOLN
INTRAMUSCULAR | Status: DC | PRN
  Administered 2016-07-30: 200 ug via INTRAVENOUS

## 2016-07-30 MED ORDER — DIPHENHYDRAMINE HCL 50 MG/ML IJ SOLN
INTRAMUSCULAR | Status: AC
  Filled 2016-07-30: qty 1

## 2016-07-30 MED ORDER — SUGAMMADEX SODIUM 200 MG/2ML IV SOLN
INTRAVENOUS | Status: DC | PRN
Start: 2016-07-30 — End: 2016-07-30
  Administered 2016-07-30: 162 mg via INTRAVENOUS

## 2016-07-30 MED ORDER — SODIUM CHLORIDE 0.9 % IV SOLN
INTRAVENOUS | Status: DC | PRN
  Administered 2016-07-30: 34.5 mL

## 2016-07-30 MED ORDER — IOPAMIDOL (ISOVUE-300) INJECTION 61%
INTRAVENOUS | Status: AC
Start: 2016-07-30 — End: 2016-07-30
  Filled 2016-07-30: qty 50

## 2016-07-30 MED ORDER — BUPIVACAINE-EPINEPHRINE (PF) 0.25% -1:200000 IJ SOLN
INTRAMUSCULAR | Status: AC
Start: 2016-07-30 — End: 2016-07-30
  Filled 2016-07-30: qty 30

## 2016-07-30 MED ORDER — ONDANSETRON HCL 4 MG/2ML IJ SOLN
INTRAMUSCULAR | Status: DC | PRN
  Administered 2016-07-30: 4 mg via INTRAVENOUS

## 2016-07-30 MED ORDER — LIDOCAINE HCL (CARDIAC) 20 MG/ML IV SOLN
INTRAVENOUS | Status: DC | PRN
  Administered 2016-07-30: 100 mg via INTRAVENOUS

## 2016-07-30 MED ORDER — LIDOCAINE HCL 1 % IJ SOLN
INTRAMUSCULAR | Status: DC | PRN
  Administered 2016-07-30: 10 mL

## 2016-07-30 MED ORDER — LACTATED RINGERS IV SOLN
INTRAVENOUS | Status: DC
  Administered 2016-07-30: 10:00:00 via INTRAVENOUS

## 2016-07-30 MED ORDER — MIDAZOLAM HCL 5 MG/5ML IJ SOLN
INTRAMUSCULAR | Status: DC | PRN
  Administered 2016-07-30: 2 mg via INTRAVENOUS

## 2016-07-30 MED ORDER — ROCURONIUM BROMIDE 100 MG/10ML IV SOLN
INTRAVENOUS | Status: DC | PRN
Start: 2016-07-30 — End: 2016-07-30
  Administered 2016-07-30: 50 mg via INTRAVENOUS

## 2016-07-30 MED ORDER — 0.9 % SODIUM CHLORIDE (POUR BTL) OPTIME
TOPICAL | Status: DC | PRN
  Administered 2016-07-30: 1000 mL

## 2016-07-30 MED ORDER — PHENYLEPHRINE 40 MCG/ML (10ML) SYRINGE FOR IV PUSH (FOR BLOOD PRESSURE SUPPORT)
PREFILLED_SYRINGE | INTRAVENOUS | Status: AC
  Filled 2016-07-30: qty 10

## 2016-07-30 MED ORDER — MIDAZOLAM HCL 2 MG/2ML IJ SOLN
INTRAMUSCULAR | Status: AC
  Filled 2016-07-30: qty 2

## 2016-07-30 MED ORDER — DIPHENHYDRAMINE HCL 50 MG/ML IJ SOLN
INTRAMUSCULAR | Status: DC | PRN
  Administered 2016-07-30: 25 mg via INTRAVENOUS

## 2016-07-30 MED ORDER — ROCURONIUM BROMIDE 10 MG/ML (PF) SYRINGE
PREFILLED_SYRINGE | INTRAVENOUS | Status: AC
  Filled 2016-07-30: qty 10

## 2016-07-30 MED ORDER — CIPROFLOXACIN IN D5W 400 MG/200ML IV SOLN
400.0000 mg | INTRAVENOUS | Status: AC
  Administered 2016-07-30: 400 mg via INTRAVENOUS
  Filled 2016-07-30: qty 200

## 2016-07-30 MED ORDER — PROPOFOL 10 MG/ML IV BOLUS
INTRAVENOUS | Status: DC | PRN
  Administered 2016-07-30: 100 mg via INTRAVENOUS

## 2016-07-30 MED ORDER — LIDOCAINE 2% (20 MG/ML) 5 ML SYRINGE
INTRAMUSCULAR | Status: AC
Start: 2016-07-30 — End: 2016-07-30
  Filled 2016-07-30: qty 5

## 2016-07-30 MED ORDER — LIDOCAINE HCL (PF) 1 % IJ SOLN
INTRAMUSCULAR | Status: AC
  Filled 2016-07-30: qty 30

## 2016-07-30 MED ORDER — SODIUM CHLORIDE 0.9 % IR SOLN
Status: DC | PRN
  Administered 2016-07-30: 200 mL

## 2016-07-30 MED ORDER — OXYCODONE HCL 5 MG PO TABS: 5.0000 mg | ORAL_TABLET | Freq: Four times a day (QID) | ORAL | 0 refills | Status: AC | PRN

## 2016-07-30 MED ORDER — FENTANYL CITRATE (PF) 100 MCG/2ML IJ SOLN
INTRAMUSCULAR | Status: AC
  Filled 2016-07-30: qty 2

## 2016-07-30 MED ORDER — PHENYLEPHRINE HCL 10 MG/ML IJ SOLN
INTRAMUSCULAR | Status: DC | PRN
  Administered 2016-07-30 (×2): 80 ug via INTRAVENOUS

## 2016-07-30 MED ORDER — PROPOFOL 10 MG/ML IV BOLUS
INTRAVENOUS | Status: AC
  Filled 2016-07-30: qty 20

## 2016-07-30 MED ORDER — LACTATED RINGERS IV SOLN
INTRAVENOUS | Status: DC | PRN
  Administered 2016-07-30: 11:00:00 via INTRAVENOUS

## 2016-07-30 SURGICAL SUPPLY — 45 items
ADH SKN CLS APL DERMABOND .7 (GAUZE/BANDAGES/DRESSINGS) ×1
APPLIER CLIP ROT 10 11.4 M/L (STAPLE) ×3
APR CLP MED LRG 11.4X10 (STAPLE) ×1
BAG SPEC RTRVL LRG 6X4 10 (ENDOMECHANICALS) ×1
BLADE SURG ROTATE 9660 (MISCELLANEOUS) IMPLANT
CANISTER SUCTION 2500CC (MISCELLANEOUS) ×3 IMPLANT
CHLORAPREP W/TINT 26ML (MISCELLANEOUS) ×3 IMPLANT
CLIP APPLIE ROT 10 11.4 M/L (STAPLE) ×1 IMPLANT
COVER MAYO STAND STRL (DRAPES) ×3 IMPLANT
COVER SURGICAL LIGHT HANDLE (MISCELLANEOUS) ×3 IMPLANT
DERMABOND ADVANCED (GAUZE/BANDAGES/DRESSINGS) ×2
DERMABOND ADVANCED .7 DNX12 (GAUZE/BANDAGES/DRESSINGS) ×1 IMPLANT
DRAPE C-ARM 42X72 X-RAY (DRAPES) ×3 IMPLANT
DRAPE WARM FLUID 44X44 (DRAPE) ×3 IMPLANT
ELECT REM PT RETURN 9FT ADLT (ELECTROSURGICAL) ×3
ELECTRODE REM PT RTRN 9FT ADLT (ELECTROSURGICAL) ×1 IMPLANT
FILTER SMOKE EVAC LAPAROSHD (FILTER) IMPLANT
GLOVE BIO SURGEON STRL SZ 6 (GLOVE) ×3 IMPLANT
GLOVE BIOGEL PI IND STRL 6.5 (GLOVE) ×1 IMPLANT
GLOVE BIOGEL PI INDICATOR 6.5 (GLOVE) ×2
GOWN STRL REUS W/ TWL LRG LVL3 (GOWN DISPOSABLE) ×2 IMPLANT
GOWN STRL REUS W/TWL 2XL LVL3 (GOWN DISPOSABLE) ×3 IMPLANT
GOWN STRL REUS W/TWL LRG LVL3 (GOWN DISPOSABLE) ×6
KIT BASIN OR (CUSTOM PROCEDURE TRAY) ×3 IMPLANT
KIT ROOM TURNOVER OR (KITS) ×3 IMPLANT
L-HOOK LAP DISP 36CM (ELECTROSURGICAL) ×3
LHOOK LAP DISP 36CM (ELECTROSURGICAL) ×1 IMPLANT
NS IRRIG 1000ML POUR BTL (IV SOLUTION) ×3 IMPLANT
PAD ARMBOARD 7.5X6 YLW CONV (MISCELLANEOUS) ×3 IMPLANT
PENCIL BUTTON HOLSTER BLD 10FT (ELECTRODE) ×3 IMPLANT
POUCH SPECIMEN RETRIEVAL 10MM (ENDOMECHANICALS) ×3 IMPLANT
SCISSORS LAP 5X35 DISP (ENDOMECHANICALS) ×3 IMPLANT
SET CHOLANGIOGRAPH 5 50 .035 (SET/KITS/TRAYS/PACK) ×3 IMPLANT
SET IRRIG TUBING LAPAROSCOPIC (IRRIGATION / IRRIGATOR) ×3 IMPLANT
SLEEVE ENDOPATH XCEL 5M (ENDOMECHANICALS) ×3 IMPLANT
SPECIMEN JAR SMALL (MISCELLANEOUS) ×3 IMPLANT
SUT MNCRL AB 4-0 PS2 18 (SUTURE) ×3 IMPLANT
SUT MON AB 4-0 PC3 18 (SUTURE) ×2 IMPLANT
TOWEL OR 17X24 6PK STRL BLUE (TOWEL DISPOSABLE) ×3 IMPLANT
TOWEL OR 17X26 10 PK STRL BLUE (TOWEL DISPOSABLE) ×3 IMPLANT
TRAY LAPAROSCOPIC MC (CUSTOM PROCEDURE TRAY) ×3 IMPLANT
TROCAR XCEL BLUNT TIP 100MML (ENDOMECHANICALS) ×3 IMPLANT
TROCAR XCEL NON-BLD 11X100MML (ENDOMECHANICALS) ×3 IMPLANT
TROCAR XCEL NON-BLD 5MMX100MML (ENDOMECHANICALS) ×3 IMPLANT
TUBING INSUFFLATION (TUBING) ×3 IMPLANT

## 2016-07-30 NOTE — Transfer of Care (Signed)
Immediate Anesthesia Transfer of Care Note  Patient: Audrey Conway  Procedure(s) Performed: Procedure(s): LAPAROSCOPIC CHOLECYSTECTOMY WITH INTRAOPERATIVE CHOLANGIOGRAM (N/A)  Patient Location: PACU  Anesthesia Type:General  Level of Consciousness: awake  Airway & Oxygen Therapy: Patient Spontanous Breathing  Post-op Assessment: Report given to RN and Post -op Vital signs reviewed and stable  Post vital signs: Reviewed and stable  Last Vitals:  Vitals:   07/30/16 0952 07/30/16 1252  BP: (!) 155/80 (P) 121/64  Pulse: 85 (P) 97  Resp: 18 (P) 16  Temp: 37 C     Last Pain:  Vitals:   07/30/16 0952  TempSrc: Oral      Patients Stated Pain Goal: 1 ([id number])  Complications: No apparent anesthesia complications

## 2016-07-30 NOTE — Interval H&P Note (Signed)
History and Physical Interval Note:  07/30/2016 11:10 AM  Audrey Conway  has presented today for surgery, with the diagnosis of chronic calculous cholecystitis  The various methods of treatment have been discussed with the patient and family. After consideration of risks, benefits and other options for treatment, the patient has consented to  Procedure(s): LAPAROSCOPIC CHOLECYSTECTOMY WITH POSSIBLE INTRAOPERATIVE CHOLANGIOGRAM (N/A) as a surgical intervention .  The patient's history has been reviewed, patient examined, no change in status, stable for surgery.  I have reviewed the patient's chart and labs.  Questions were answered to the patient's satisfaction.     Caliph Borowiak

## 2016-07-30 NOTE — Discharge Instructions (Addendum)
[place] Office Phone Number [phone]   POST OP INSTRUCTIONS  Always review your discharge instruction sheet given to you by the facility where your surgery was performed.  IF YOU HAVE DISABILITY OR FAMILY LEAVE FORMS, YOU MUST BRING THEM TO THE OFFICE FOR PROCESSING.  DO NOT GIVE THEM TO YOUR DOCTOR.  1. A prescription for pain medication may be given to you upon discharge.  Take your pain medication as prescribed, if needed.  If narcotic pain medicine is not needed, then you may take acetaminophen (Tylenol) or ibuprofen (Advil) as needed. 2. Take your usually prescribed medications unless otherwise directed 3. If you need a refill on your pain medication, please contact your pharmacy.  They will contact our office to request authorization.  Prescriptions will not be filled after 5pm or on week-ends. 4. You should eat very light the first 24 hours after surgery, such as soup, crackers, pudding, etc.  Resume your normal diet the day after surgery 5. It is common to experience some constipation if taking pain medication after surgery.  Increasing fluid intake and taking a stool softener will usually help or prevent this problem from occurring.  A mild laxative (Milk of Magnesia or Miralax) should be taken according to package directions if there are no bowel movements after 48 hours. 6. You may shower in 48 hours.  The surgical glue will flake off in 2-3 weeks.   7. ACTIVITIES:  No strenuous activity or heavy lifting for 2 week.   a. You may drive when you no longer are taking prescription pain medication, you can comfortably wear a seatbelt, and you can safely maneuver your car and apply brakes. b. RETURN TO WORK:  _________1-2 weeks_____________ Dennis Bast should see your doctor in the office for a follow-up appointment approximately three-four weeks after your surgery.    WHEN TO CALL YOUR DOCTOR: 1. Fever over 101.0 2. Nausea and/or vomiting. 3. Extreme swelling or  bruising. 4. Continued bleeding from incision. 5. Increased pain, redness, or drainage from the incision.  The clinic staff is available to answer your questions during regular business hours.  Please dont hesitate to call and ask to speak to one of the nurses for clinical concerns.  If you have a medical emergency, go to the nearest emergency room or call 911.  A surgeon from [facility] is always on call at the hospital.  For further questions, please visit [email or web address]

## 2016-07-30 NOTE — Anesthesia Preprocedure Evaluation (Signed)
Anesthesia Evaluation  Patient identified by MRN, date of birth, ID band Patient awake    Reviewed: Allergy & Precautions, NPO status , Patient's Chart, lab work & pertinent test results  Airway Mallampati: I  TM Distance: >3 FB Neck ROM: Full    Dental   Pulmonary    Pulmonary exam normal        Cardiovascular Normal cardiovascular exam     Neuro/Psych    GI/Hepatic GERD  Medicated and Controlled,  Endo/Other    Renal/GU      Musculoskeletal   Abdominal   Peds  Hematology   Anesthesia Other Findings   Reproductive/Obstetrics                             Anesthesia Physical Anesthesia Plan  ASA: II  Anesthesia Plan: General   Post-op Pain Management:    Induction: Intravenous  Airway Management Planned: Oral ETT  Additional Equipment:   Intra-op Plan:   Post-operative Plan: Extubation in OR  Informed Consent: I have reviewed the patients History and Physical, chart, labs and discussed the procedure including the risks, benefits and alternatives for the proposed anesthesia with the patient or authorized representative who has indicated his/her understanding and acceptance.     Plan Discussed with: CRNA and Surgeon  Anesthesia Plan Comments:         Anesthesia Quick Evaluation

## 2016-07-30 NOTE — Anesthesia Procedure Notes (Signed)
Procedure Name: Intubation Date/Time: 07/30/2016 11:33 AM Performed by: Myna Bright Pre-anesthesia Checklist: Patient identified, Emergency Drugs available, Patient being monitored and Suction available Patient Re-evaluated:Patient Re-evaluated prior to inductionOxygen Delivery Method: Circle system utilized Preoxygenation: Pre-oxygenation with 100% oxygen Intubation Type: IV induction Ventilation: Mask ventilation without difficulty and Oral airway inserted - appropriate to patient size Laryngoscope Size: Mac and 3 Grade View: Grade III Tube type: Oral Tube size: 7.0 mm Number of attempts: 1 Airway Equipment and Method: Stylet Placement Confirmation: ETT inserted through vocal cords under direct vision,  positive ETCO2 and breath sounds checked- equal and bilateral Secured at: 21 cm Tube secured with: Tape Dental Injury: Teeth and Oropharynx as per pre-operative assessment

## 2016-07-30 NOTE — Op Note (Signed)
Laparoscopic Cholecystectomy with IOC Procedure Note  Indications: This patient presents with chronic calculous cholecystitis and will undergo laparoscopic cholecystectomy.  Pre-operative Diagnosis: see above  Post-operative Diagnosis: Same  Surgeon: Stark Klein   Assistants: Cedric Fishman, RNFA  Anesthesia: General endotracheal anesthesia and local  Procedure Details  The patient was seen again in the Holding Room. The risks, benefits, complications, treatment options, and expected outcomes were discussed with the patient. The possibilities of  bleeding, recurrent infection, damage to nearby structures, the need for additional procedures, failure to diagnose a condition, the possible need to convert to an open procedure, and creating a complication requiring transfusion or operation were discussed with the patient. The likelihood of improving the patient's symptoms with return to their baseline status is good.    The patient and/or family concurred with the proposed plan, giving informed consent. The site of surgery properly noted. The patient was taken to Operating Room, and the procedure verified as Laparoscopic Cholecystectomy with Intraoperative Cholangiogram. A Time Out was held and the above information confirmed.  Prior to the induction of general anesthesia, antibiotic prophylaxis was administered. General endotracheal anesthesia was then administered and tolerated well. After the induction, the abdomen was prepped with Chloraprep and draped in the sterile fashion. The patient was positioned in the supine position.  Local anesthetic agent was injected into the skin near the umbilicus and an incision made. We dissected down to the abdominal fascia with blunt dissection.  The fascia was incised vertically and we entered the peritoneal cavity bluntly.  A pursestring suture of 0-Vicryl was placed around the fascial opening.  The Hasson cannula was inserted and secured with the stay suture.   Pneumoperitoneum was then created with CO2 and tolerated well without any adverse changes in the patient's vital signs. An 11-mm port was placed in the subxiphoid position.  Two 5-mm ports were placed in the right upper quadrant. All skin incisions were infiltrated with a local anesthetic agent before making the incision and placing the trocars.   We positioned the patient in reverse Trendelenburg, tilted slightly to the patient's left.  The gallbladder was identified, the fundus grasped and retracted cephalad. Adhesions were lysed bluntly and with the electrocautery where indicated, taking care not to injure any adjacent organs or viscus. The infundibulum was grasped and retracted laterally, exposing the peritoneum overlying the triangle of Calot. This was then divided and exposed in a blunt fashion. A critical view of the cystic duct and cystic artery was obtained.  The cystic duct was clearly identified and bluntly dissected circumferentially. The cystic duct was ligated with a clip distally.   An incision was made in the cystic duct and the [facility] cholangiogram catheter introduced. The catheter was secured using a clip. A cholangiogram was then performed, demonstrating good filling of left and right hepatic duct, normal anatomy, and contrast going into the duodenum without filling defect.  The cystic duct was then ligated with clips and divided. The cystic artery was identified, dissected free, ligated with clips and divided as well.   The gallbladder was dissected from the liver bed in retrograde fashion with the electrocautery. The gallbladder was removed and placed in an Endocatch bag.  The gallbladder and Endocatch bag were then removed through the umbilical port site.  The liver bed was irrigated and inspected. Hemostasis was achieved with the electrocautery. Copious irrigation was utilized and was repeatedly aspirated until clear.    We again inspected the right upper quadrant for hemostasis.   Pneumoperitoneum was released  as we removed the trocars.   The pursestring suture was used to close the umbilical fascia.  4-0 Monocryl was used to close the skin.   The skin was cleaned and dry, and Dermabond was applied. The patient was then extubated and brought to the recovery room in stable condition. Instrument, sponge, and needle counts were correct at closure and at the conclusion of the case.   Findings: Chronic inflammation of gallbladder.  Assorted size stones.    Estimated Blood Loss: minimal         Drains: none.            Specimens: Gallbladder to pathology       Complications: None; patient tolerated the procedure well.         Disposition: PACU - hemodynamically stable.         Condition: stable

## 2016-07-31 ENCOUNTER — Encounter (HOSPITAL_COMMUNITY): Payer: Self-pay | Admitting: General Surgery

## 2016-07-31 NOTE — Anesthesia Postprocedure Evaluation (Signed)
Anesthesia Post Note  Patient: Audrey Conway  Procedure(s) Performed: Procedure(s) (LRB): LAPAROSCOPIC CHOLECYSTECTOMY WITH INTRAOPERATIVE CHOLANGIOGRAM (N/A)  Patient location during evaluation: PACU Anesthesia Type: General Level of consciousness: awake and alert Pain management: pain level controlled Vital Signs Assessment: post-procedure vital signs reviewed and stable Respiratory status: spontaneous breathing, nonlabored ventilation, respiratory function stable and patient connected to nasal cannula oxygen Cardiovascular status: blood pressure returned to baseline and stable Postop Assessment: no signs of nausea or vomiting Anesthetic complications: no    Last Vitals:  Vitals:   07/30/16 1336 07/30/16 1357  BP: 128/77 138/79  Pulse: 75 76  Resp: 13 18  Temp:      Last Pain:  Vitals:   07/30/16 1342  TempSrc:   PainSc: 0-No pain                 Reighn Kaplan DAVID

## 2016-12-18 ENCOUNTER — Other Ambulatory Visit: Payer: Self-pay | Admitting: Internal Medicine

## 2016-12-18 DIAGNOSIS — Z1231 Encounter for screening mammogram for malignant neoplasm of breast: Secondary | ICD-10-CM

## 2017-01-09 ENCOUNTER — Ambulatory Visit
Admission: RE | Admit: 2017-01-09 | Discharge: 2017-01-09 | Disposition: A | Discharge status: Home / Self Care | Payer: BLUE CROSS/BLUE SHIELD | Source: Ambulatory Visit | Attending: Internal Medicine | Admitting: Internal Medicine

## 2017-01-09 DIAGNOSIS — Z1231 Encounter for screening mammogram for malignant neoplasm of breast: Secondary | ICD-10-CM

## 2017-07-11 IMAGING — MG MM DIGITAL SCREENING BILAT W/ TOMO W/ CAD
8 series · 9 of 24 positions shown · non-contrast
Comparison: Previous exam(s).

CLINICAL DATA: Screening.

EXAM:
DIGITAL SCREENING BILATERAL MAMMOGRAM WITH 3D TOMO WITH CAD

[R MLO]
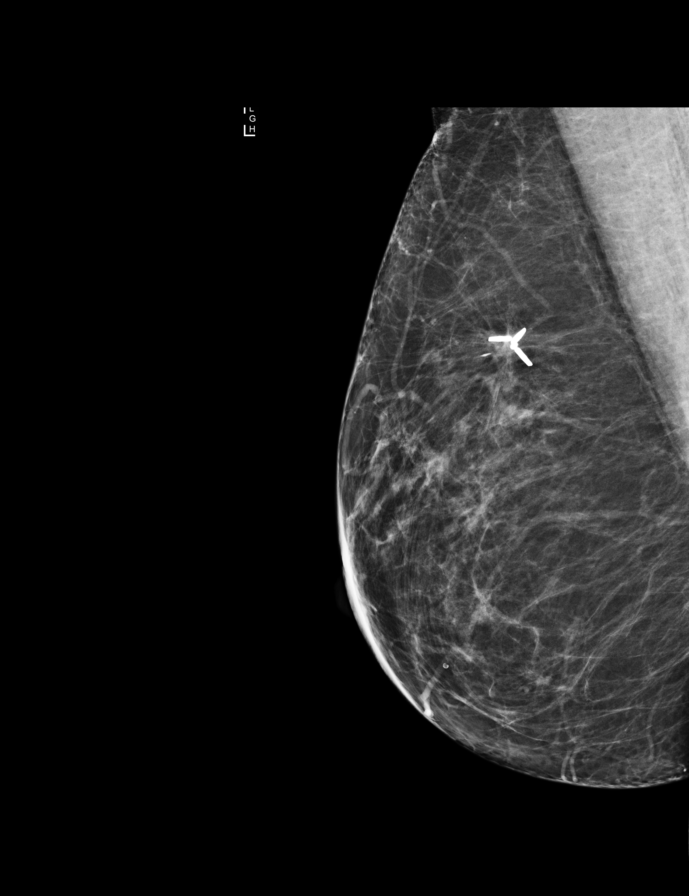

[L CC]
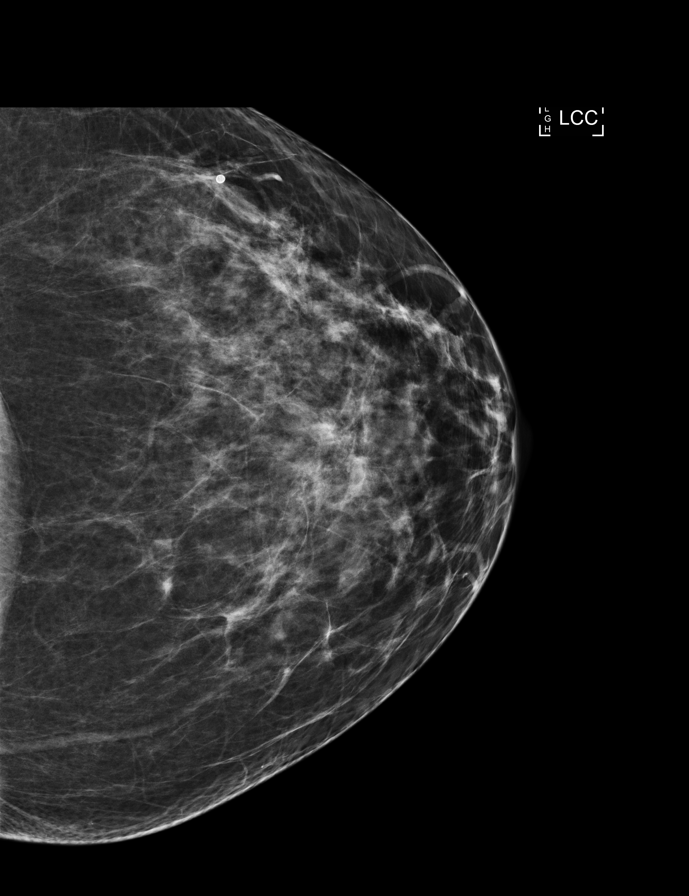

[L MLO]
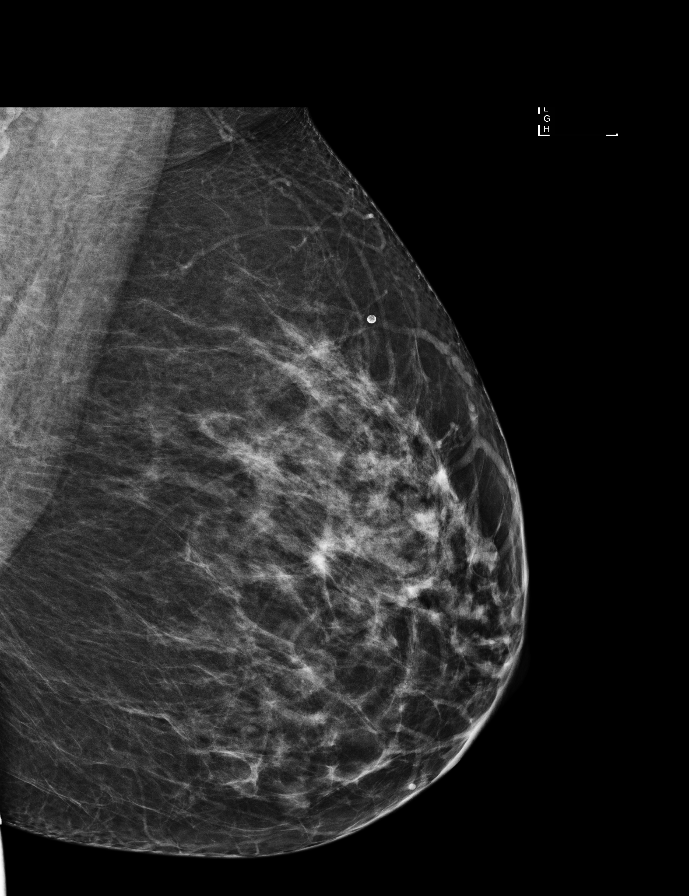

[R CC]
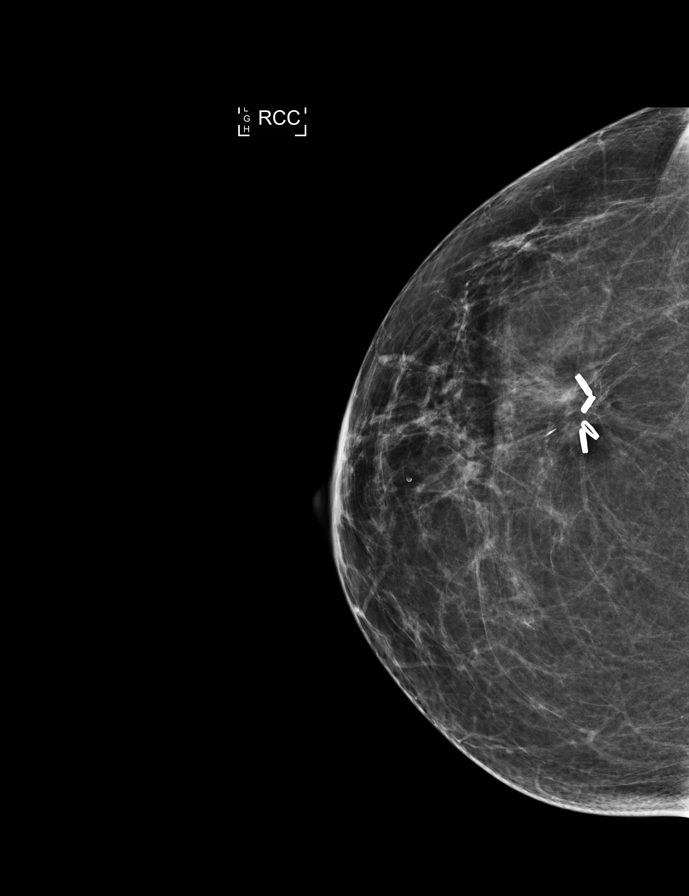

[L MLO tomo · 2 of 82 frames shown]
[frame 27/82]
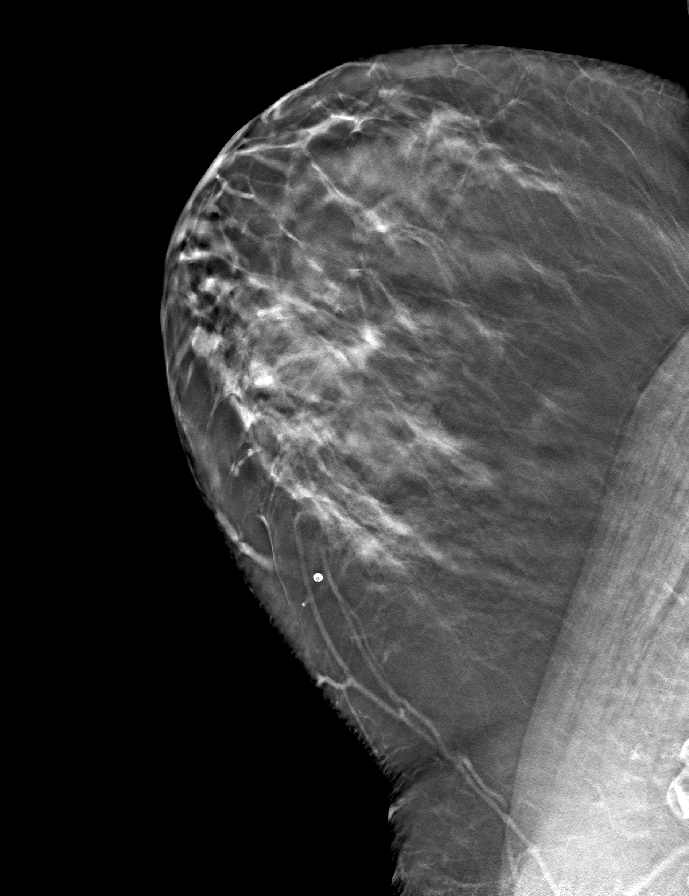
[frame 41/82]
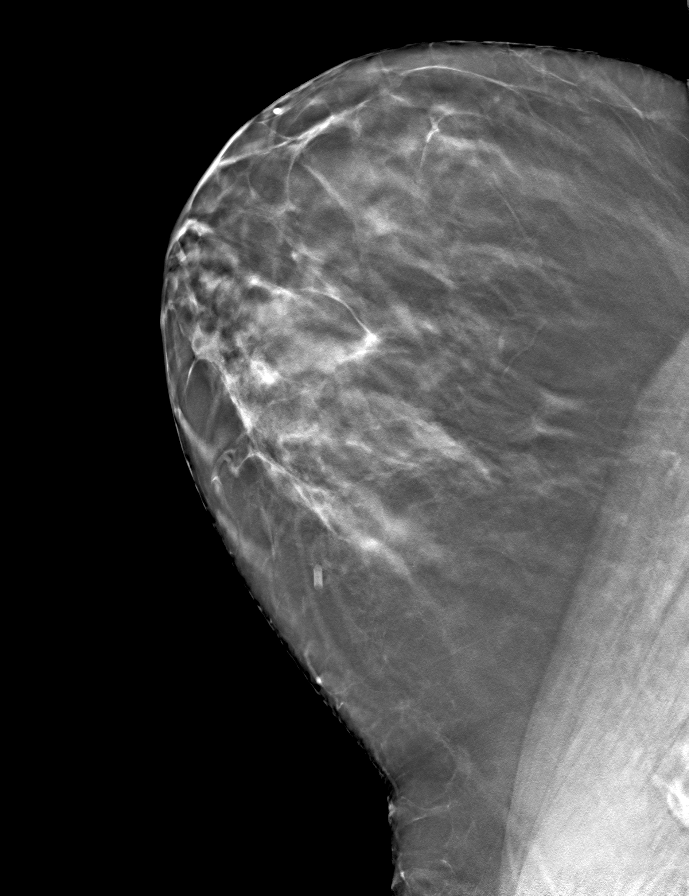

[R MLO tomo · tomo slice 38/75.0]
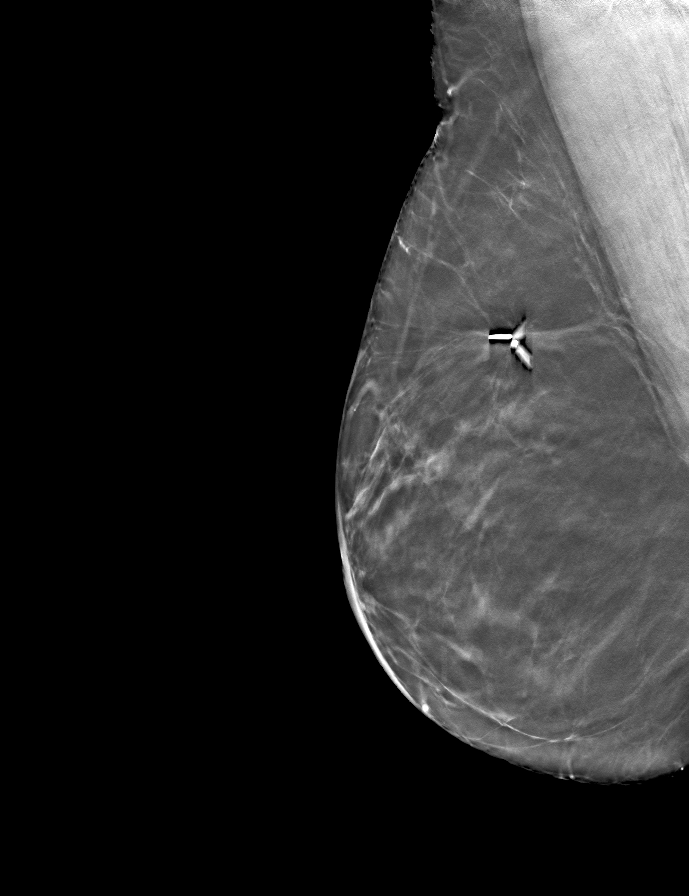

[R CC tomo · tomo slice 37/72.0]
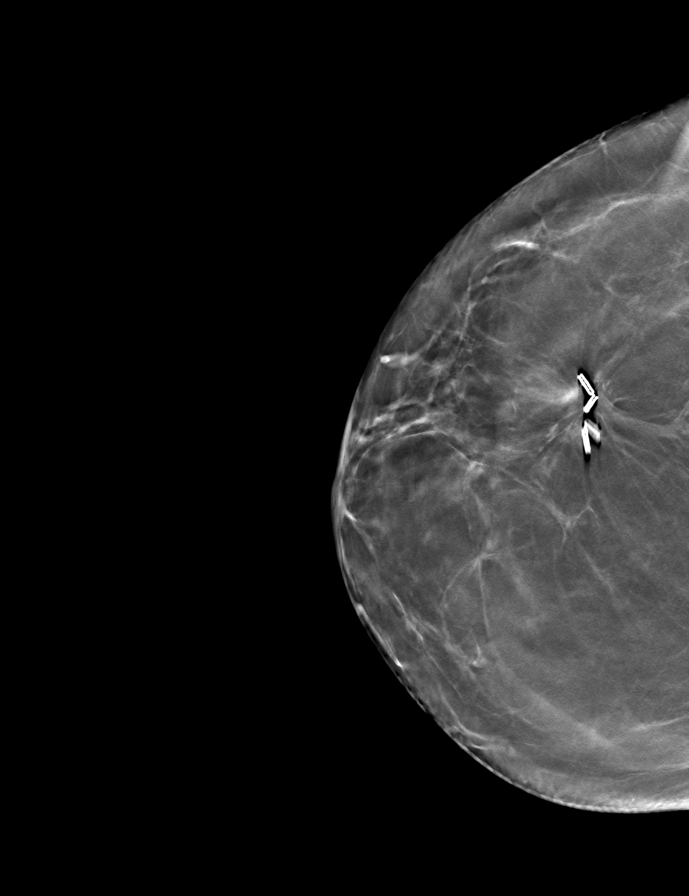

[L CC tomo · tomo slice 41/80.0]
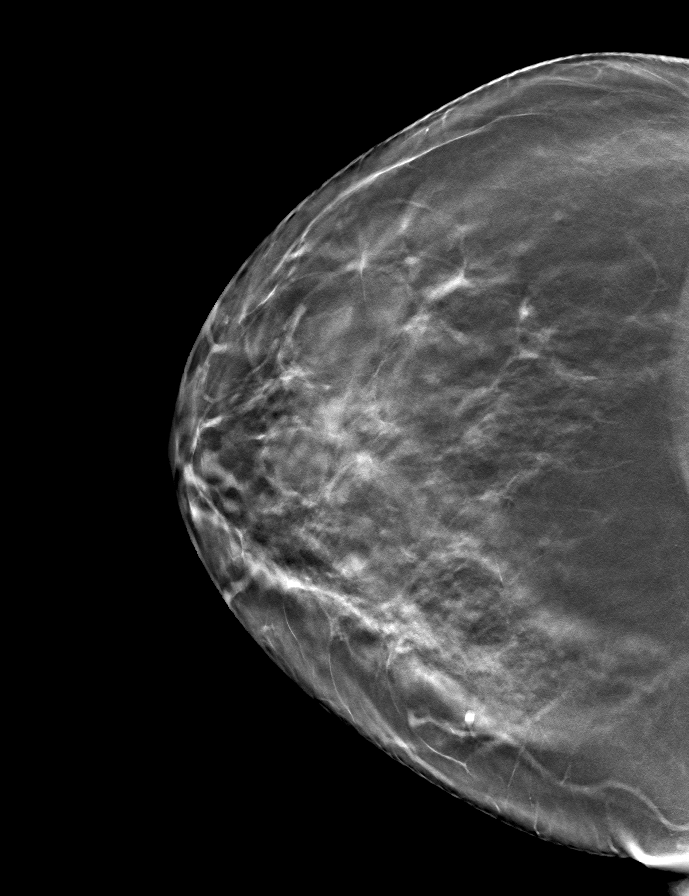

[9 of 24 positions shown; findings below may reference images not displayed]

ACR Breast Density Category c: The breast tissue is heterogeneously
dense, which may obscure small masses.
FINDINGS: There are no findings suspicious for malignancy. Images were
processed with CAD.
IMPRESSION: No mammographic evidence of malignancy. A result letter of this
screening mammogram will be mailed directly to the patient.

RECOMMENDATION:
Screening mammogram in one year. (Code:OA-G-1SS)

BI-RADS CATEGORY  1: Negative.

## 2018-02-15 ENCOUNTER — Other Ambulatory Visit: Payer: Self-pay | Admitting: Internal Medicine

## 2018-02-15 DIAGNOSIS — Z1231 Encounter for screening mammogram for malignant neoplasm of breast: Secondary | ICD-10-CM

## 2018-03-09 ENCOUNTER — Ambulatory Visit
Admission: RE | Admit: 2018-03-09 | Discharge: 2018-03-09 | Disposition: A | Discharge status: Home / Self Care | Payer: BLUE CROSS/BLUE SHIELD | Source: Ambulatory Visit | Attending: Internal Medicine | Admitting: Internal Medicine

## 2018-03-09 DIAGNOSIS — Z1231 Encounter for screening mammogram for malignant neoplasm of breast: Secondary | ICD-10-CM

## 2018-03-09 HISTORY — DX: Personal history of antineoplastic chemotherapy: Z92.21

## 2018-03-09 HISTORY — DX: Personal history of irradiation: Z92.3

## 2018-04-01 IMAGING — RF DG CHOLANGIOGRAM OPERATIVE
1 series · 10 of 10 positions shown · non-contrast
Comparison: Abdominal ultrasound - 12/05/2015; CT abdomen pelvis -
05/30/2014

CLINICAL DATA: Intraoperative cholangiogram during laparoscopic
cholecystectomy.

EXAM:
INTRAOPERATIVE CHOLANGIOGRAM
FLUOROSCOPY TIME:  19 seconds

[Series 1: run · 4 acquisitions, 10 frames shown]
[im 1/4]
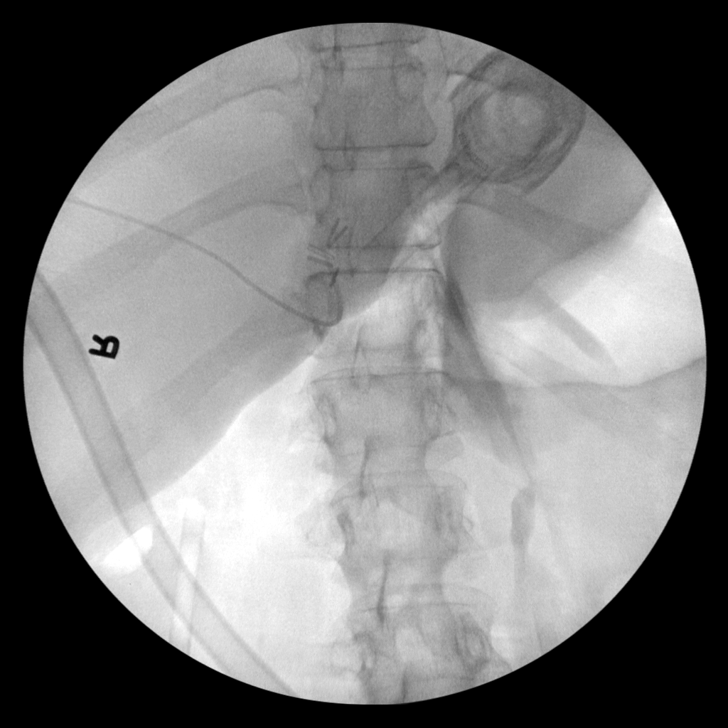
[im 1/4]
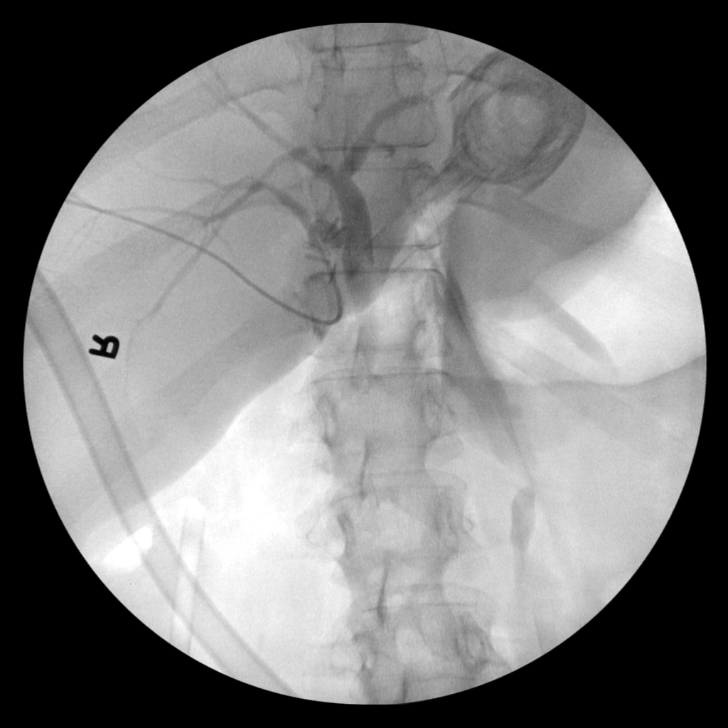
[im 1/4]
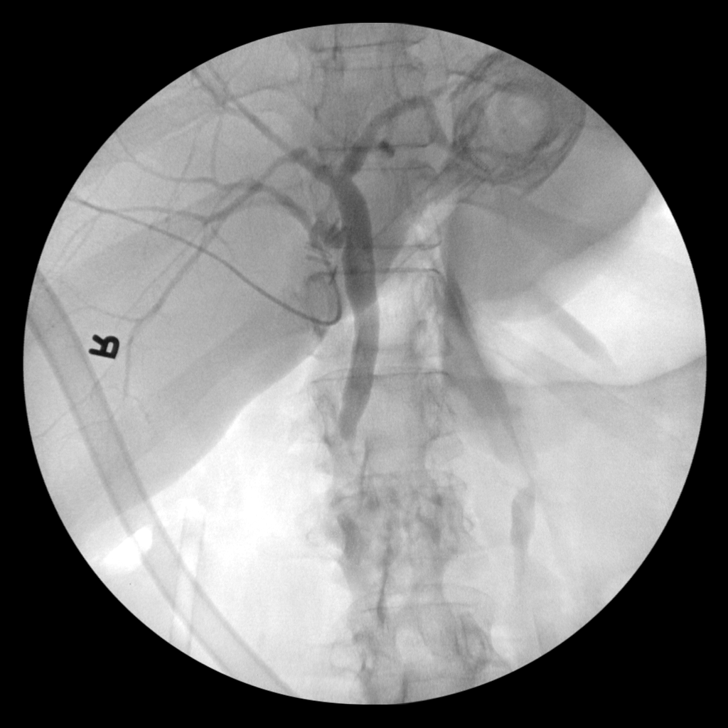
[im 1/4]
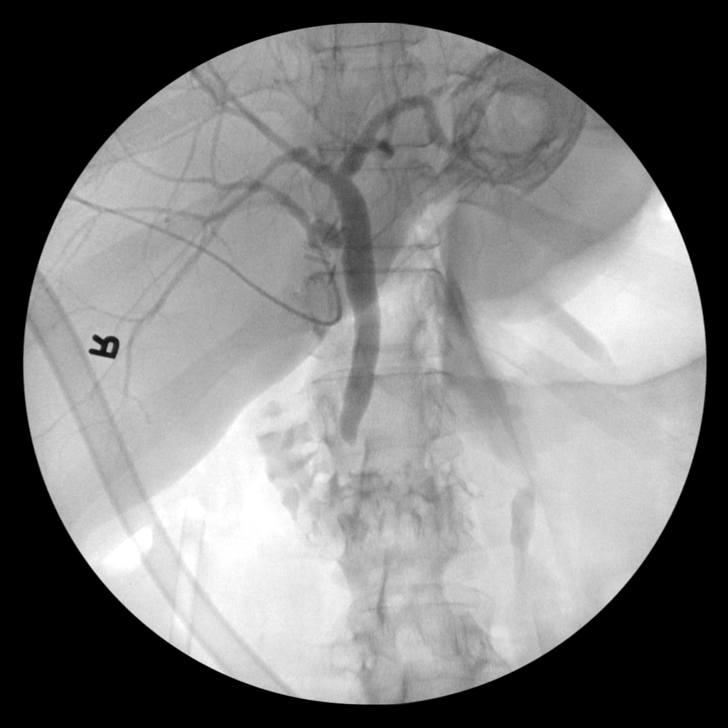
[im 2/4]
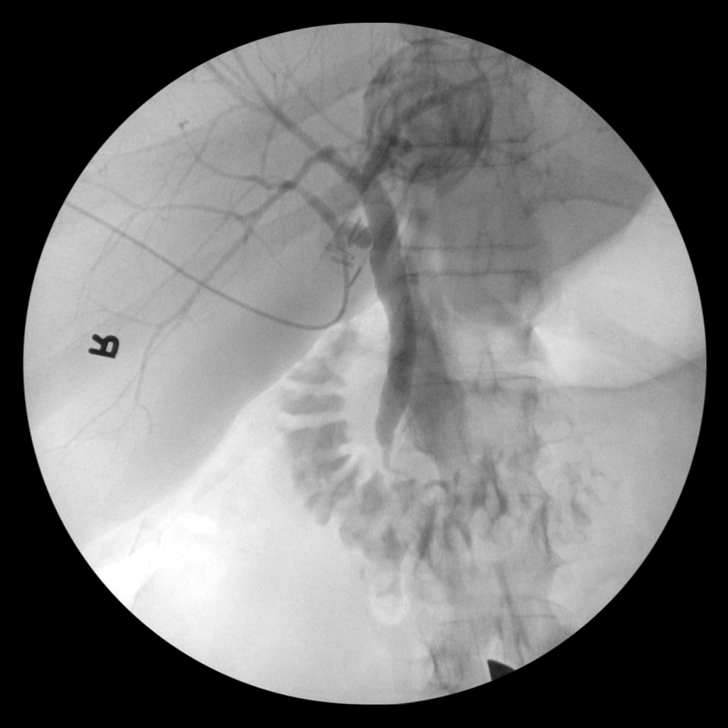
[im 2/4]
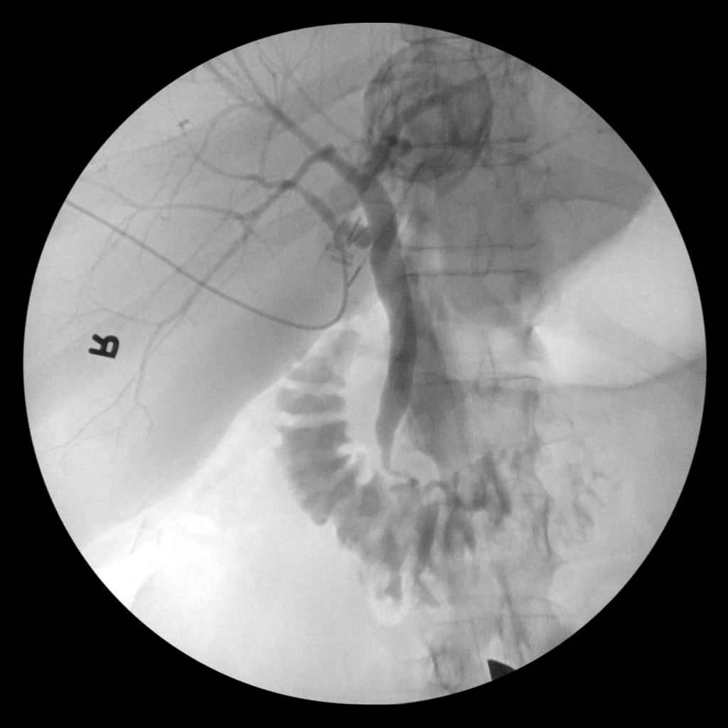
[im 2/4]
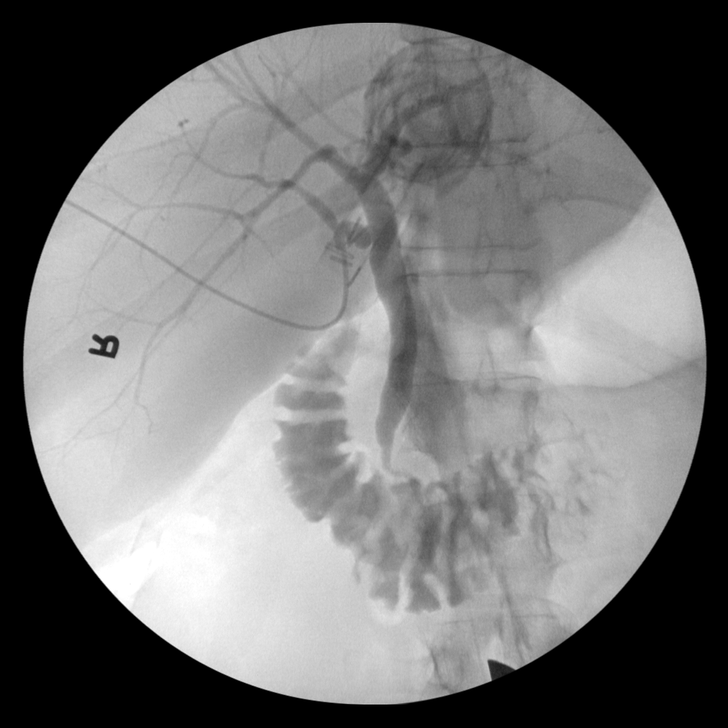
[im 2/4]
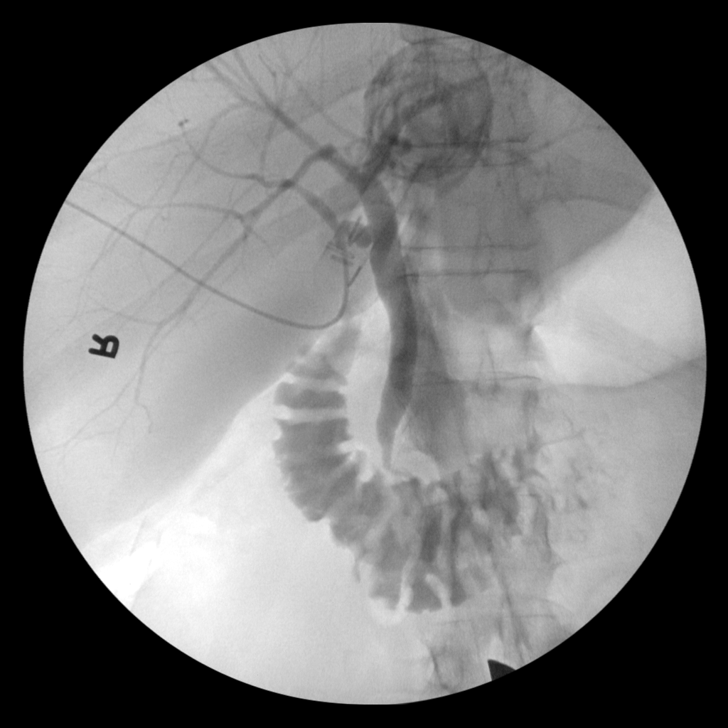
[im 3/4]
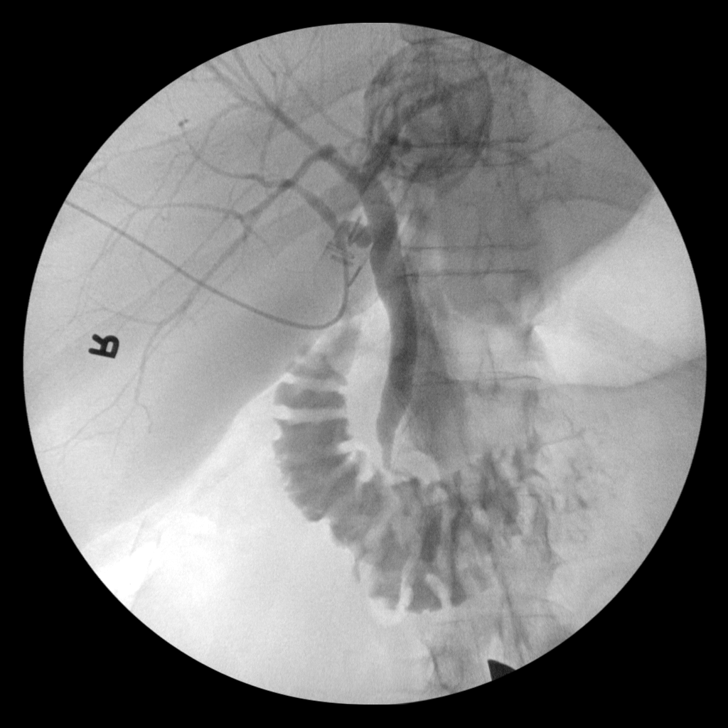
[im 4/4]
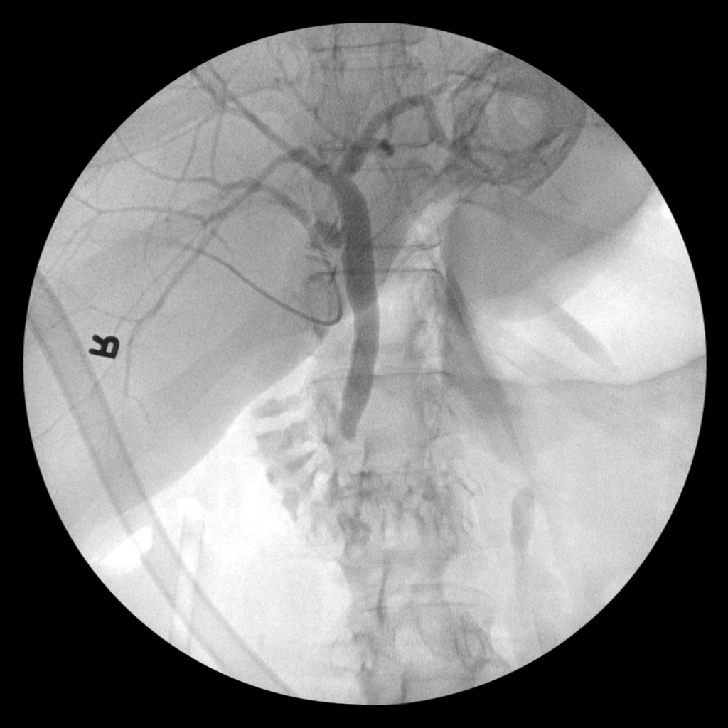

[10 of 10 positions shown; findings below may reference images not displayed]

FINDINGS: Intraoperative cholangiographic images of the right upper abdominal
quadrant during laparoscopic cholecystectomy are provided for
review.

Surgical clips overlie the expected location of the gallbladder
fossa.

Contrast injection demonstrates selective cannulation of the central
aspect of the cystic duct.

There appears to be a common origin of the cystic and right
posterior biliary ducts.

There is passage of contrast through the central aspect of the
cystic duct with filling of a non dilated common bile duct. There is
passage of contrast though the CBD and into the descending portion
of the duodenum.

There is minimal reflux of injected contrast into the common hepatic
duct and central aspect of the non dilated intrahepatic biliary
system.

There are no discrete filling defects within the opacified portions
of the biliary system to suggest the presence of
choledocholithiasis.
IMPRESSION: 1. No evidence of choledocholithiasis.
2. Apparent common origin of the cystic and right posterior biliary
ducts. Correlation with the operative report is recommended

## 2019-04-28 ENCOUNTER — Other Ambulatory Visit: Payer: Self-pay | Admitting: Internal Medicine

## 2019-04-28 DIAGNOSIS — Z1231 Encounter for screening mammogram for malignant neoplasm of breast: Secondary | ICD-10-CM

## 2019-06-13 ENCOUNTER — Other Ambulatory Visit: Payer: Self-pay

## 2019-06-13 ENCOUNTER — Ambulatory Visit
Admission: RE | Admit: 2019-06-13 | Discharge: 2019-06-13 | Disposition: A | Discharge status: Home / Self Care | Payer: 59 | Source: Ambulatory Visit | Attending: Internal Medicine | Admitting: Internal Medicine

## 2019-06-13 DIAGNOSIS — Z1231 Encounter for screening mammogram for malignant neoplasm of breast: Secondary | ICD-10-CM
